# Patient Record
Sex: Male | Born: 1945
Health system: Southern US, Community
[De-identification: ages and names within clinical notes are randomized; demographics above are authoritative.]

## PROBLEM LIST (undated history)

## (undated) DIAGNOSIS — H3320 Serous retinal detachment, unspecified eye: Secondary | ICD-10-CM

## (undated) DIAGNOSIS — H409 Unspecified glaucoma: Secondary | ICD-10-CM

## (undated) DIAGNOSIS — R413 Other amnesia: Secondary | ICD-10-CM

## (undated) DIAGNOSIS — G8929 Other chronic pain: Secondary | ICD-10-CM

## (undated) DIAGNOSIS — M25559 Pain in unspecified hip: Secondary | ICD-10-CM

## (undated) DIAGNOSIS — C61 Malignant neoplasm of prostate: Secondary | ICD-10-CM

## (undated) DIAGNOSIS — E7800 Pure hypercholesterolemia, unspecified: Secondary | ICD-10-CM

## (undated) DIAGNOSIS — I10 Essential (primary) hypertension: Secondary | ICD-10-CM

## (undated) HISTORY — DX: Pure hypercholesterolemia, unspecified: E78.00

## (undated) HISTORY — DX: Serous retinal detachment, unspecified eye: H33.20

## (undated) HISTORY — DX: Other chronic pain: G89.29

## (undated) HISTORY — DX: Malignant neoplasm of prostate: C61

## (undated) HISTORY — PX: TOTAL HIP ARTHROPLASTY: SHX124

## (undated) HISTORY — DX: Pain in unspecified hip: M25.559

## (undated) HISTORY — DX: Essential (primary) hypertension: I10

## (undated) HISTORY — DX: Unspecified glaucoma: H40.9

## (undated) HISTORY — DX: Other amnesia: R41.3

---

## 2002-02-07 ENCOUNTER — Encounter (HOSPITAL_COMMUNITY): Admission: RE | Admit: 2002-02-07 | Discharge: 2002-03-09 | Payer: Self-pay

## 2006-10-13 ENCOUNTER — Ambulatory Visit (HOSPITAL_COMMUNITY): Admission: RE | Admit: 2006-10-13 | Discharge: 2006-10-13 | Payer: Non-veteran care | Admitting: General Surgery

## 2010-08-30 NOTE — H&P (Signed)
Bernard Roy, BOSSI               ACCOUNT NO.:  [id number]   MEDICAL RECORD NO.:  [id number]          PATIENT TYPE:  AMB   LOCATION:                                FACILITY:  [facility]   PHYSICIAN:  Dalia Heading, M.D.  DATE OF BIRTH:  [birth year]   DATE OF ADMISSION:  DATE OF DISCHARGE:  LH                              HISTORY & PHYSICAL   CHIEF COMPLAINT:  History of colon polyps.   HISTORY OF PRESENT ILLNESS:  Patient is a 65 year old black male who is  referred for endoscopic evaluation.  He needs a colonoscopy for followup  of colon polyps.  No abdominal pain, weight loss, nausea, vomiting,  diarrhea, constipation, melena, hematochezia have been noted.  He last  had a colonoscopy five years ago.  There is no family history of colon  carcinoma.   PAST MEDICAL HISTORY:  Hypertension.   PAST SURGICAL HISTORY:  Hip replacements.   CURRENT MEDICATIONS:  Motrin, a blood pressure pill, high cholesterol  pill.   ALLERGIES:  No known drug allergies.   REVIEW OF SYSTEMS:  Noncontributory.   PHYSICAL EXAMINATION:  GENERAL:  Patient is a well-developed and well-  nourished __________ male in no acute distress.  LUNGS:  Clear to auscultation with equal breath sounds bilaterally.  HEART:  Regular rate and rhythm without S3, S4, or murmurs.  ABDOMEN:  Soft, nontender, nondistended.  No hepatosplenomegaly or  masses are noted.  RECTAL:  Deferred to the procedure.   IMPRESSION:  History of colon polyps.   PLAN:  Patient is scheduled for a colonoscopy on October 13, 2006.  The  risks and benefits of the procedure, including bleeding and perforation,  were fully explained to the patient, who gave informed consent.      Dalia Heading, M.D.  Electronically Signed    MAJ/MEDQ  D:  09/22/2006  T:  09/22/2006  Job:  [id number]

## 2012-09-13 ENCOUNTER — Ambulatory Visit: Payer: Non-veteran care | Attending: Internal Medicine | Admitting: Physical Therapy

## 2012-09-13 DIAGNOSIS — R262 Difficulty in walking, not elsewhere classified: Secondary | ICD-10-CM | POA: Insufficient documentation

## 2012-09-13 DIAGNOSIS — IMO0001 Reserved for inherently not codable concepts without codable children: Secondary | ICD-10-CM | POA: Insufficient documentation

## 2012-09-13 DIAGNOSIS — R5381 Other malaise: Secondary | ICD-10-CM | POA: Insufficient documentation

## 2012-09-13 DIAGNOSIS — M545 Low back pain, unspecified: Secondary | ICD-10-CM | POA: Insufficient documentation

## 2012-09-13 DIAGNOSIS — R293 Abnormal posture: Secondary | ICD-10-CM | POA: Insufficient documentation

## 2012-09-28 ENCOUNTER — Ambulatory Visit: Payer: Non-veteran care | Admitting: Physical Therapy

## 2012-10-11 ENCOUNTER — Ambulatory Visit: Payer: Non-veteran care | Admitting: Physical Therapy

## 2012-10-14 ENCOUNTER — Ambulatory Visit: Payer: Non-veteran care | Attending: Internal Medicine | Admitting: Physical Therapy

## 2012-10-14 DIAGNOSIS — R5381 Other malaise: Secondary | ICD-10-CM | POA: Insufficient documentation

## 2012-10-14 DIAGNOSIS — M545 Low back pain, unspecified: Secondary | ICD-10-CM | POA: Insufficient documentation

## 2012-10-14 DIAGNOSIS — R262 Difficulty in walking, not elsewhere classified: Secondary | ICD-10-CM | POA: Insufficient documentation

## 2012-10-14 DIAGNOSIS — R293 Abnormal posture: Secondary | ICD-10-CM | POA: Insufficient documentation

## 2012-10-14 DIAGNOSIS — IMO0001 Reserved for inherently not codable concepts without codable children: Secondary | ICD-10-CM | POA: Insufficient documentation

## 2012-10-18 ENCOUNTER — Ambulatory Visit: Payer: Non-veteran care | Admitting: Physical Therapy

## 2012-10-21 ENCOUNTER — Ambulatory Visit: Payer: Non-veteran care | Admitting: Physical Therapy

## 2012-10-26 ENCOUNTER — Ambulatory Visit: Payer: Non-veteran care | Admitting: Physical Therapy

## 2012-10-28 ENCOUNTER — Ambulatory Visit: Payer: Non-veteran care | Admitting: Physical Therapy

## 2012-11-01 ENCOUNTER — Ambulatory Visit: Payer: Non-veteran care | Admitting: Physical Therapy

## 2012-11-04 ENCOUNTER — Ambulatory Visit: Payer: Non-veteran care | Admitting: Physical Therapy

## 2014-04-14 HISTORY — PX: COLONOSCOPY: SHX174

## 2016-09-20 LAB — BASIC METABOLIC PANEL: GLUCOSE: 97

## 2017-07-10 DIAGNOSIS — G4700 Insomnia, unspecified: Secondary | ICD-10-CM | POA: Diagnosis not present

## 2017-07-10 DIAGNOSIS — H409 Unspecified glaucoma: Secondary | ICD-10-CM | POA: Diagnosis not present

## 2017-07-10 DIAGNOSIS — M545 Low back pain: Secondary | ICD-10-CM | POA: Diagnosis not present

## 2017-07-10 DIAGNOSIS — I10 Essential (primary) hypertension: Secondary | ICD-10-CM | POA: Diagnosis not present

## 2017-07-10 DIAGNOSIS — M62830 Muscle spasm of back: Secondary | ICD-10-CM | POA: Diagnosis not present

## 2017-07-10 DIAGNOSIS — E782 Mixed hyperlipidemia: Secondary | ICD-10-CM | POA: Diagnosis not present

## 2017-07-21 DIAGNOSIS — M62830 Muscle spasm of back: Secondary | ICD-10-CM | POA: Diagnosis not present

## 2017-07-21 DIAGNOSIS — M545 Low back pain: Secondary | ICD-10-CM | POA: Diagnosis not present

## 2017-07-21 DIAGNOSIS — I10 Essential (primary) hypertension: Secondary | ICD-10-CM | POA: Diagnosis not present

## 2017-07-21 DIAGNOSIS — H409 Unspecified glaucoma: Secondary | ICD-10-CM | POA: Diagnosis not present

## 2017-07-21 DIAGNOSIS — R7301 Impaired fasting glucose: Secondary | ICD-10-CM | POA: Diagnosis not present

## 2017-07-21 DIAGNOSIS — G4700 Insomnia, unspecified: Secondary | ICD-10-CM | POA: Diagnosis not present

## 2017-07-21 DIAGNOSIS — E782 Mixed hyperlipidemia: Secondary | ICD-10-CM | POA: Diagnosis not present

## 2017-07-24 DIAGNOSIS — M545 Low back pain: Secondary | ICD-10-CM | POA: Diagnosis not present

## 2017-07-24 DIAGNOSIS — M62830 Muscle spasm of back: Secondary | ICD-10-CM | POA: Diagnosis not present

## 2017-07-24 DIAGNOSIS — E782 Mixed hyperlipidemia: Secondary | ICD-10-CM | POA: Diagnosis not present

## 2017-07-24 DIAGNOSIS — H01149 Xeroderma of unspecified eye, unspecified eyelid: Secondary | ICD-10-CM | POA: Diagnosis not present

## 2017-07-24 DIAGNOSIS — R69 Illness, unspecified: Secondary | ICD-10-CM | POA: Diagnosis not present

## 2017-07-24 DIAGNOSIS — Z6835 Body mass index (BMI) 35.0-35.9, adult: Secondary | ICD-10-CM | POA: Diagnosis not present

## 2017-07-24 DIAGNOSIS — K5900 Constipation, unspecified: Secondary | ICD-10-CM | POA: Diagnosis not present

## 2017-07-24 DIAGNOSIS — H401130 Primary open-angle glaucoma, bilateral, stage unspecified: Secondary | ICD-10-CM | POA: Diagnosis not present

## 2017-07-24 DIAGNOSIS — I10 Essential (primary) hypertension: Secondary | ICD-10-CM | POA: Diagnosis not present

## 2017-07-24 DIAGNOSIS — R7301 Impaired fasting glucose: Secondary | ICD-10-CM | POA: Diagnosis not present

## 2017-10-01 DIAGNOSIS — I10 Essential (primary) hypertension: Secondary | ICD-10-CM | POA: Diagnosis not present

## 2017-10-01 DIAGNOSIS — H5200 Hypermetropia, unspecified eye: Secondary | ICD-10-CM | POA: Diagnosis not present

## 2017-10-01 DIAGNOSIS — Z7409 Other reduced mobility: Secondary | ICD-10-CM | POA: Diagnosis not present

## 2017-10-01 DIAGNOSIS — M62830 Muscle spasm of back: Secondary | ICD-10-CM | POA: Diagnosis not present

## 2017-10-01 DIAGNOSIS — M545 Low back pain: Secondary | ICD-10-CM | POA: Diagnosis not present

## 2017-10-01 DIAGNOSIS — Z6835 Body mass index (BMI) 35.0-35.9, adult: Secondary | ICD-10-CM | POA: Diagnosis not present

## 2017-10-01 DIAGNOSIS — H40009 Preglaucoma, unspecified, unspecified eye: Secondary | ICD-10-CM | POA: Diagnosis not present

## 2017-10-12 DIAGNOSIS — M62830 Muscle spasm of back: Secondary | ICD-10-CM | POA: Diagnosis not present

## 2017-10-12 DIAGNOSIS — E782 Mixed hyperlipidemia: Secondary | ICD-10-CM | POA: Diagnosis not present

## 2017-10-12 DIAGNOSIS — H01149 Xeroderma of unspecified eye, unspecified eyelid: Secondary | ICD-10-CM | POA: Diagnosis not present

## 2017-10-12 DIAGNOSIS — H409 Unspecified glaucoma: Secondary | ICD-10-CM | POA: Diagnosis not present

## 2017-10-12 DIAGNOSIS — K5900 Constipation, unspecified: Secondary | ICD-10-CM | POA: Diagnosis not present

## 2017-10-12 DIAGNOSIS — G4700 Insomnia, unspecified: Secondary | ICD-10-CM | POA: Diagnosis not present

## 2017-10-12 DIAGNOSIS — Z6835 Body mass index (BMI) 35.0-35.9, adult: Secondary | ICD-10-CM | POA: Diagnosis not present

## 2017-10-12 DIAGNOSIS — I10 Essential (primary) hypertension: Secondary | ICD-10-CM | POA: Diagnosis not present

## 2017-10-12 DIAGNOSIS — H401130 Primary open-angle glaucoma, bilateral, stage unspecified: Secondary | ICD-10-CM | POA: Diagnosis not present

## 2017-10-12 DIAGNOSIS — M545 Low back pain: Secondary | ICD-10-CM | POA: Diagnosis not present

## 2017-10-22 DIAGNOSIS — K5900 Constipation, unspecified: Secondary | ICD-10-CM | POA: Diagnosis not present

## 2017-10-22 DIAGNOSIS — H409 Unspecified glaucoma: Secondary | ICD-10-CM | POA: Diagnosis not present

## 2017-10-22 DIAGNOSIS — G4700 Insomnia, unspecified: Secondary | ICD-10-CM | POA: Diagnosis not present

## 2017-10-22 DIAGNOSIS — R69 Illness, unspecified: Secondary | ICD-10-CM | POA: Diagnosis not present

## 2017-10-22 DIAGNOSIS — I10 Essential (primary) hypertension: Secondary | ICD-10-CM | POA: Diagnosis not present

## 2017-10-22 DIAGNOSIS — H401130 Primary open-angle glaucoma, bilateral, stage unspecified: Secondary | ICD-10-CM | POA: Diagnosis not present

## 2017-10-22 DIAGNOSIS — E782 Mixed hyperlipidemia: Secondary | ICD-10-CM | POA: Diagnosis not present

## 2017-10-22 DIAGNOSIS — M62830 Muscle spasm of back: Secondary | ICD-10-CM | POA: Diagnosis not present

## 2017-10-22 DIAGNOSIS — H01149 Xeroderma of unspecified eye, unspecified eyelid: Secondary | ICD-10-CM | POA: Diagnosis not present

## 2017-10-22 DIAGNOSIS — M545 Low back pain: Secondary | ICD-10-CM | POA: Diagnosis not present

## 2017-10-28 DIAGNOSIS — M545 Low back pain: Secondary | ICD-10-CM | POA: Diagnosis not present

## 2017-10-28 DIAGNOSIS — H409 Unspecified glaucoma: Secondary | ICD-10-CM | POA: Diagnosis not present

## 2017-10-28 DIAGNOSIS — K5900 Constipation, unspecified: Secondary | ICD-10-CM | POA: Diagnosis not present

## 2017-10-28 DIAGNOSIS — I10 Essential (primary) hypertension: Secondary | ICD-10-CM | POA: Diagnosis not present

## 2017-10-28 DIAGNOSIS — N189 Chronic kidney disease, unspecified: Secondary | ICD-10-CM | POA: Diagnosis not present

## 2017-10-28 DIAGNOSIS — M62830 Muscle spasm of back: Secondary | ICD-10-CM | POA: Diagnosis not present

## 2017-10-28 DIAGNOSIS — E782 Mixed hyperlipidemia: Secondary | ICD-10-CM | POA: Diagnosis not present

## 2017-10-28 DIAGNOSIS — Q821 Xeroderma pigmentosum: Secondary | ICD-10-CM | POA: Diagnosis not present

## 2017-10-28 DIAGNOSIS — R7303 Prediabetes: Secondary | ICD-10-CM | POA: Diagnosis not present

## 2017-10-28 DIAGNOSIS — G4700 Insomnia, unspecified: Secondary | ICD-10-CM | POA: Diagnosis not present

## 2017-11-30 ENCOUNTER — Encounter (INDEPENDENT_AMBULATORY_CARE_PROVIDER_SITE_OTHER): Payer: Self-pay | Admitting: *Deleted

## 2017-12-08 ENCOUNTER — Ambulatory Visit: Payer: Self-pay | Admitting: Orthopedic Surgery

## 2017-12-21 ENCOUNTER — Ambulatory Visit (INDEPENDENT_AMBULATORY_CARE_PROVIDER_SITE_OTHER): Payer: Medicare HMO | Admitting: Podiatry

## 2017-12-21 ENCOUNTER — Encounter: Payer: Self-pay | Admitting: Podiatry

## 2017-12-21 DIAGNOSIS — Q828 Other specified congenital malformations of skin: Secondary | ICD-10-CM | POA: Diagnosis not present

## 2017-12-21 DIAGNOSIS — B351 Tinea unguium: Secondary | ICD-10-CM | POA: Diagnosis not present

## 2017-12-21 DIAGNOSIS — M79674 Pain in right toe(s): Secondary | ICD-10-CM

## 2017-12-21 DIAGNOSIS — M79672 Pain in left foot: Secondary | ICD-10-CM

## 2017-12-21 DIAGNOSIS — M79675 Pain in left toe(s): Secondary | ICD-10-CM

## 2017-12-21 DIAGNOSIS — M79671 Pain in right foot: Secondary | ICD-10-CM

## 2017-12-21 NOTE — Progress Notes (Signed)
   Subjective:    Patient ID: Bernard Roy, male    DOB: 1945-06-27, 72 y.o.   MRN: [id number]  HPI  72 year old male presents the office today for concerns of thick, painful, elongated toenails that he cannot trim himself as well as the calluses to both of his feet.  He is asking for new inserts and shoes as well.  He denies any swelling or redness he has no other concerns.  He is a patient at the [facility].   Review of Systems  All other systems reviewed and are negative.  History reviewed. No pertinent past medical history.  History reviewed. No pertinent surgical history.   Current Outpatient Medications:  .  LISINOPRIL PO, Take by mouth., Disp: , Rfl:  .  amLODipine (NORVASC) 5 MG tablet, TAKE 1 TABLET BY MOUTH ONCE DAILY AT NIGHT, Disp: , Rfl: 2  No Known Allergies       Objective:   Physical Exam General: AAO x3, NAD  Dermatological: Nails are hypertrophic, dystrophic, brittle, discolored, elongated 10. No surrounding redness or drainage. Tenderness nails 1-5 bilaterally.  Hyperkeratotic lesions bilateral submetatarsal 1, 5.  Without any underlying ulceration drainage or signs of infection.  No open lesions or pre-ulcerative lesions are identified today.  Vascular: Dorsalis Pedis artery and Posterior Tibial artery pedal pulses are 2/4 bilateral with immedate capillary fill time.  There is no pain with calf compression, swelling, warmth, erythema.   Neruologic: Grossly intact via light touch bilateral. Protective threshold with Semmes Wienstein monofilament intact to all pedal sites bilateral.   Musculoskeletal: Prominent metatarsal head plantarly with atrophy of the [place].  Muscular strength 5/5 in all groups tested bilateral.    Assessment & Plan:  72 year old male with symptomatic onychomycosis, hyperkeratotic lesions -Treatment options discussed including all alternatives, risks, and complications -Etiology of symptoms were discussed -Nails debrided 10  without complications or bleeding. -Hyperkeratotic lesion sharply debrided x4 without any complications or bleeding. -Prescription for new shoes as well as for inserts was given today to [employer] and is going to be sent to the [place].  -Daily foot inspection -Follow-up in 3 months or sooner if any problems arise. In the meantime, encouraged to call the office with any questions, concerns, change in symptoms.   Celesta Gentile, DPM

## 2018-02-19 ENCOUNTER — Encounter (INDEPENDENT_AMBULATORY_CARE_PROVIDER_SITE_OTHER): Payer: Self-pay | Admitting: *Deleted

## 2018-05-10 DIAGNOSIS — H01149 Xeroderma of unspecified eye, unspecified eyelid: Secondary | ICD-10-CM | POA: Diagnosis not present

## 2018-05-10 DIAGNOSIS — E782 Mixed hyperlipidemia: Secondary | ICD-10-CM | POA: Diagnosis not present

## 2018-05-10 DIAGNOSIS — H409 Unspecified glaucoma: Secondary | ICD-10-CM | POA: Diagnosis not present

## 2018-05-10 DIAGNOSIS — G4700 Insomnia, unspecified: Secondary | ICD-10-CM | POA: Diagnosis not present

## 2018-05-10 DIAGNOSIS — I10 Essential (primary) hypertension: Secondary | ICD-10-CM | POA: Diagnosis not present

## 2018-05-10 DIAGNOSIS — N189 Chronic kidney disease, unspecified: Secondary | ICD-10-CM | POA: Diagnosis not present

## 2018-05-10 DIAGNOSIS — Z6835 Body mass index (BMI) 35.0-35.9, adult: Secondary | ICD-10-CM | POA: Diagnosis not present

## 2018-05-10 DIAGNOSIS — R252 Cramp and spasm: Secondary | ICD-10-CM | POA: Diagnosis not present

## 2018-05-10 DIAGNOSIS — R7303 Prediabetes: Secondary | ICD-10-CM | POA: Diagnosis not present

## 2018-05-10 DIAGNOSIS — Z125 Encounter for screening for malignant neoplasm of prostate: Secondary | ICD-10-CM | POA: Diagnosis not present

## 2018-05-10 DIAGNOSIS — R7301 Impaired fasting glucose: Secondary | ICD-10-CM | POA: Diagnosis not present

## 2018-05-10 DIAGNOSIS — M545 Low back pain: Secondary | ICD-10-CM | POA: Diagnosis not present

## 2018-05-10 DIAGNOSIS — N182 Chronic kidney disease, stage 2 (mild): Secondary | ICD-10-CM | POA: Diagnosis not present

## 2018-05-10 DIAGNOSIS — K5900 Constipation, unspecified: Secondary | ICD-10-CM | POA: Diagnosis not present

## 2018-07-02 DIAGNOSIS — M545 Low back pain: Secondary | ICD-10-CM | POA: Diagnosis not present

## 2018-07-02 DIAGNOSIS — G8929 Other chronic pain: Secondary | ICD-10-CM | POA: Diagnosis not present

## 2018-08-04 DIAGNOSIS — M545 Low back pain: Secondary | ICD-10-CM | POA: Diagnosis not present

## 2018-08-04 DIAGNOSIS — M47817 Spondylosis without myelopathy or radiculopathy, lumbosacral region: Secondary | ICD-10-CM | POA: Diagnosis not present

## 2018-08-04 DIAGNOSIS — M160 Bilateral primary osteoarthritis of hip: Secondary | ICD-10-CM | POA: Diagnosis not present

## 2018-08-04 DIAGNOSIS — G894 Chronic pain syndrome: Secondary | ICD-10-CM | POA: Diagnosis not present

## 2018-08-10 DIAGNOSIS — I10 Essential (primary) hypertension: Secondary | ICD-10-CM | POA: Diagnosis not present

## 2018-08-10 DIAGNOSIS — M25559 Pain in unspecified hip: Secondary | ICD-10-CM | POA: Diagnosis not present

## 2018-08-10 DIAGNOSIS — M542 Cervicalgia: Secondary | ICD-10-CM | POA: Diagnosis not present

## 2018-08-10 DIAGNOSIS — M47819 Spondylosis without myelopathy or radiculopathy, site unspecified: Secondary | ICD-10-CM | POA: Diagnosis not present

## 2018-08-10 DIAGNOSIS — M545 Low back pain: Secondary | ICD-10-CM | POA: Diagnosis not present

## 2018-08-10 DIAGNOSIS — M2578 Osteophyte, vertebrae: Secondary | ICD-10-CM | POA: Diagnosis not present

## 2018-08-11 DIAGNOSIS — M47817 Spondylosis without myelopathy or radiculopathy, lumbosacral region: Secondary | ICD-10-CM | POA: Diagnosis not present

## 2018-09-07 DIAGNOSIS — R7301 Impaired fasting glucose: Secondary | ICD-10-CM | POA: Diagnosis not present

## 2018-09-07 DIAGNOSIS — R7303 Prediabetes: Secondary | ICD-10-CM | POA: Diagnosis not present

## 2018-09-07 DIAGNOSIS — I10 Essential (primary) hypertension: Secondary | ICD-10-CM | POA: Diagnosis not present

## 2018-09-07 DIAGNOSIS — E782 Mixed hyperlipidemia: Secondary | ICD-10-CM | POA: Diagnosis not present

## 2018-09-07 DIAGNOSIS — N189 Chronic kidney disease, unspecified: Secondary | ICD-10-CM | POA: Diagnosis not present

## 2018-09-10 DIAGNOSIS — R7303 Prediabetes: Secondary | ICD-10-CM | POA: Diagnosis not present

## 2018-09-10 DIAGNOSIS — I129 Hypertensive chronic kidney disease with stage 1 through stage 4 chronic kidney disease, or unspecified chronic kidney disease: Secondary | ICD-10-CM | POA: Diagnosis not present

## 2018-09-10 DIAGNOSIS — N183 Chronic kidney disease, stage 3 (moderate): Secondary | ICD-10-CM | POA: Diagnosis not present

## 2018-09-10 DIAGNOSIS — E782 Mixed hyperlipidemia: Secondary | ICD-10-CM | POA: Diagnosis not present

## 2018-09-10 DIAGNOSIS — R945 Abnormal results of liver function studies: Secondary | ICD-10-CM | POA: Diagnosis not present

## 2018-09-13 DIAGNOSIS — M47817 Spondylosis without myelopathy or radiculopathy, lumbosacral region: Secondary | ICD-10-CM | POA: Diagnosis not present

## 2018-09-20 DIAGNOSIS — G4700 Insomnia, unspecified: Secondary | ICD-10-CM | POA: Diagnosis not present

## 2018-09-20 DIAGNOSIS — E782 Mixed hyperlipidemia: Secondary | ICD-10-CM | POA: Diagnosis not present

## 2018-09-20 DIAGNOSIS — N390 Urinary tract infection, site not specified: Secondary | ICD-10-CM | POA: Diagnosis not present

## 2018-09-20 DIAGNOSIS — G8929 Other chronic pain: Secondary | ICD-10-CM | POA: Diagnosis not present

## 2018-09-20 DIAGNOSIS — F5101 Primary insomnia: Secondary | ICD-10-CM | POA: Diagnosis not present

## 2018-09-20 DIAGNOSIS — H01149 Xeroderma of unspecified eye, unspecified eyelid: Secondary | ICD-10-CM | POA: Diagnosis not present

## 2018-09-24 DIAGNOSIS — H01149 Xeroderma of unspecified eye, unspecified eyelid: Secondary | ICD-10-CM | POA: Diagnosis not present

## 2018-09-24 DIAGNOSIS — M545 Low back pain: Secondary | ICD-10-CM | POA: Diagnosis not present

## 2018-09-24 DIAGNOSIS — G4700 Insomnia, unspecified: Secondary | ICD-10-CM | POA: Diagnosis not present

## 2018-09-24 DIAGNOSIS — F5101 Primary insomnia: Secondary | ICD-10-CM | POA: Diagnosis not present

## 2018-09-24 DIAGNOSIS — R300 Dysuria: Secondary | ICD-10-CM | POA: Diagnosis not present

## 2018-09-24 DIAGNOSIS — E782 Mixed hyperlipidemia: Secondary | ICD-10-CM | POA: Diagnosis not present

## 2018-09-24 DIAGNOSIS — R7303 Prediabetes: Secondary | ICD-10-CM | POA: Diagnosis not present

## 2018-09-24 DIAGNOSIS — G8929 Other chronic pain: Secondary | ICD-10-CM | POA: Diagnosis not present

## 2018-09-27 DIAGNOSIS — M47817 Spondylosis without myelopathy or radiculopathy, lumbosacral region: Secondary | ICD-10-CM | POA: Diagnosis not present

## 2018-09-27 DIAGNOSIS — G894 Chronic pain syndrome: Secondary | ICD-10-CM | POA: Diagnosis not present

## 2018-09-27 DIAGNOSIS — M160 Bilateral primary osteoarthritis of hip: Secondary | ICD-10-CM | POA: Diagnosis not present

## 2018-09-27 DIAGNOSIS — Z96643 Presence of artificial hip joint, bilateral: Secondary | ICD-10-CM | POA: Diagnosis not present

## 2018-09-28 DIAGNOSIS — M25559 Pain in unspecified hip: Secondary | ICD-10-CM | POA: Diagnosis not present

## 2018-09-28 DIAGNOSIS — I10 Essential (primary) hypertension: Secondary | ICD-10-CM | POA: Diagnosis not present

## 2018-09-28 DIAGNOSIS — M545 Low back pain: Secondary | ICD-10-CM | POA: Diagnosis not present

## 2018-09-28 DIAGNOSIS — M542 Cervicalgia: Secondary | ICD-10-CM | POA: Diagnosis not present

## 2018-09-28 DIAGNOSIS — M47819 Spondylosis without myelopathy or radiculopathy, site unspecified: Secondary | ICD-10-CM | POA: Diagnosis not present

## 2018-09-30 DIAGNOSIS — E782 Mixed hyperlipidemia: Secondary | ICD-10-CM | POA: Diagnosis not present

## 2018-09-30 DIAGNOSIS — M545 Low back pain: Secondary | ICD-10-CM | POA: Diagnosis not present

## 2018-09-30 DIAGNOSIS — N390 Urinary tract infection, site not specified: Secondary | ICD-10-CM | POA: Diagnosis not present

## 2018-09-30 DIAGNOSIS — I10 Essential (primary) hypertension: Secondary | ICD-10-CM | POA: Diagnosis not present

## 2018-09-30 DIAGNOSIS — R7303 Prediabetes: Secondary | ICD-10-CM | POA: Diagnosis not present

## 2018-09-30 DIAGNOSIS — Z0000 Encounter for general adult medical examination without abnormal findings: Secondary | ICD-10-CM | POA: Diagnosis not present

## 2018-09-30 DIAGNOSIS — H409 Unspecified glaucoma: Secondary | ICD-10-CM | POA: Diagnosis not present

## 2018-09-30 DIAGNOSIS — G4700 Insomnia, unspecified: Secondary | ICD-10-CM | POA: Diagnosis not present

## 2018-10-06 DIAGNOSIS — G894 Chronic pain syndrome: Secondary | ICD-10-CM | POA: Diagnosis not present

## 2018-10-06 DIAGNOSIS — Z79891 Long term (current) use of opiate analgesic: Secondary | ICD-10-CM | POA: Diagnosis not present

## 2018-10-06 DIAGNOSIS — M47817 Spondylosis without myelopathy or radiculopathy, lumbosacral region: Secondary | ICD-10-CM | POA: Diagnosis not present

## 2018-10-06 DIAGNOSIS — Z79899 Other long term (current) drug therapy: Secondary | ICD-10-CM | POA: Diagnosis not present

## 2018-11-03 DIAGNOSIS — M545 Low back pain: Secondary | ICD-10-CM | POA: Diagnosis not present

## 2018-11-03 DIAGNOSIS — G894 Chronic pain syndrome: Secondary | ICD-10-CM | POA: Diagnosis not present

## 2018-11-03 DIAGNOSIS — M160 Bilateral primary osteoarthritis of hip: Secondary | ICD-10-CM | POA: Diagnosis not present

## 2018-11-03 DIAGNOSIS — M47817 Spondylosis without myelopathy or radiculopathy, lumbosacral region: Secondary | ICD-10-CM | POA: Diagnosis not present

## 2018-12-09 DIAGNOSIS — I10 Essential (primary) hypertension: Secondary | ICD-10-CM | POA: Diagnosis not present

## 2018-12-09 DIAGNOSIS — H409 Unspecified glaucoma: Secondary | ICD-10-CM | POA: Diagnosis not present

## 2018-12-09 DIAGNOSIS — M545 Low back pain: Secondary | ICD-10-CM | POA: Diagnosis not present

## 2018-12-09 DIAGNOSIS — E782 Mixed hyperlipidemia: Secondary | ICD-10-CM | POA: Diagnosis not present

## 2018-12-09 DIAGNOSIS — G4700 Insomnia, unspecified: Secondary | ICD-10-CM | POA: Diagnosis not present

## 2019-01-17 DIAGNOSIS — R8279 Other abnormal findings on microbiological examination of urine: Secondary | ICD-10-CM | POA: Diagnosis not present

## 2019-01-17 DIAGNOSIS — N3000 Acute cystitis without hematuria: Secondary | ICD-10-CM | POA: Diagnosis not present

## 2019-01-24 DIAGNOSIS — G4700 Insomnia, unspecified: Secondary | ICD-10-CM | POA: Diagnosis not present

## 2019-01-24 DIAGNOSIS — H409 Unspecified glaucoma: Secondary | ICD-10-CM | POA: Diagnosis not present

## 2019-01-24 DIAGNOSIS — F5101 Primary insomnia: Secondary | ICD-10-CM | POA: Diagnosis not present

## 2019-01-24 DIAGNOSIS — E782 Mixed hyperlipidemia: Secondary | ICD-10-CM | POA: Diagnosis not present

## 2019-01-24 DIAGNOSIS — I10 Essential (primary) hypertension: Secondary | ICD-10-CM | POA: Diagnosis not present

## 2019-02-01 DIAGNOSIS — E782 Mixed hyperlipidemia: Secondary | ICD-10-CM | POA: Diagnosis not present

## 2019-02-01 DIAGNOSIS — R7303 Prediabetes: Secondary | ICD-10-CM | POA: Diagnosis not present

## 2019-02-01 DIAGNOSIS — I10 Essential (primary) hypertension: Secondary | ICD-10-CM | POA: Diagnosis not present

## 2019-02-01 DIAGNOSIS — R7301 Impaired fasting glucose: Secondary | ICD-10-CM | POA: Diagnosis not present

## 2019-02-07 DIAGNOSIS — E782 Mixed hyperlipidemia: Secondary | ICD-10-CM | POA: Diagnosis not present

## 2019-02-07 DIAGNOSIS — I129 Hypertensive chronic kidney disease with stage 1 through stage 4 chronic kidney disease, or unspecified chronic kidney disease: Secondary | ICD-10-CM | POA: Diagnosis not present

## 2019-02-07 DIAGNOSIS — Z23 Encounter for immunization: Secondary | ICD-10-CM | POA: Diagnosis not present

## 2019-02-07 DIAGNOSIS — R945 Abnormal results of liver function studies: Secondary | ICD-10-CM | POA: Diagnosis not present

## 2019-02-07 DIAGNOSIS — N1831 Chronic kidney disease, stage 3a: Secondary | ICD-10-CM | POA: Diagnosis not present

## 2019-02-14 DIAGNOSIS — N1831 Chronic kidney disease, stage 3a: Secondary | ICD-10-CM | POA: Diagnosis not present

## 2019-02-14 DIAGNOSIS — E782 Mixed hyperlipidemia: Secondary | ICD-10-CM | POA: Diagnosis not present

## 2019-03-01 ENCOUNTER — Other Ambulatory Visit (HOSPITAL_COMMUNITY): Payer: Self-pay | Admitting: Urology

## 2019-03-01 DIAGNOSIS — R9721 Rising PSA following treatment for malignant neoplasm of prostate: Secondary | ICD-10-CM

## 2019-03-15 DIAGNOSIS — N1831 Chronic kidney disease, stage 3a: Secondary | ICD-10-CM | POA: Diagnosis not present

## 2019-03-15 DIAGNOSIS — M160 Bilateral primary osteoarthritis of hip: Secondary | ICD-10-CM | POA: Diagnosis not present

## 2019-03-15 DIAGNOSIS — I129 Hypertensive chronic kidney disease with stage 1 through stage 4 chronic kidney disease, or unspecified chronic kidney disease: Secondary | ICD-10-CM | POA: Diagnosis not present

## 2019-03-15 DIAGNOSIS — R945 Abnormal results of liver function studies: Secondary | ICD-10-CM | POA: Diagnosis not present

## 2019-03-15 DIAGNOSIS — E7849 Other hyperlipidemia: Secondary | ICD-10-CM | POA: Diagnosis not present

## 2019-03-23 ENCOUNTER — Encounter (HOSPITAL_COMMUNITY)
Admission: RE | Admit: 2019-03-23 | Discharge: 2019-03-23 | Disposition: A | Discharge status: Home / Self Care | Payer: Medicare Other | Source: Ambulatory Visit | Attending: Urology | Admitting: Urology

## 2019-03-23 ENCOUNTER — Other Ambulatory Visit: Payer: Self-pay

## 2019-03-23 DIAGNOSIS — R9721 Rising PSA following treatment for malignant neoplasm of prostate: Secondary | ICD-10-CM | POA: Diagnosis present

## 2019-03-23 MED ORDER — TECHNETIUM TC 99M MEDRONATE IV KIT
21.3000 | PACK | Freq: Once | INTRAVENOUS | Status: AC | PRN
  Administered 2019-03-23: 10:00:00 21.3 via INTRAVENOUS

## 2019-03-25 DIAGNOSIS — I129 Hypertensive chronic kidney disease with stage 1 through stage 4 chronic kidney disease, or unspecified chronic kidney disease: Secondary | ICD-10-CM | POA: Diagnosis not present

## 2019-03-25 DIAGNOSIS — M160 Bilateral primary osteoarthritis of hip: Secondary | ICD-10-CM | POA: Diagnosis not present

## 2019-03-25 DIAGNOSIS — R945 Abnormal results of liver function studies: Secondary | ICD-10-CM | POA: Diagnosis not present

## 2019-03-25 DIAGNOSIS — M545 Low back pain: Secondary | ICD-10-CM | POA: Diagnosis not present

## 2019-05-06 DIAGNOSIS — N189 Chronic kidney disease, unspecified: Secondary | ICD-10-CM | POA: Diagnosis not present

## 2019-05-06 DIAGNOSIS — I129 Hypertensive chronic kidney disease with stage 1 through stage 4 chronic kidney disease, or unspecified chronic kidney disease: Secondary | ICD-10-CM | POA: Diagnosis not present

## 2019-05-09 DIAGNOSIS — R3915 Urgency of urination: Secondary | ICD-10-CM | POA: Diagnosis not present

## 2019-05-10 DIAGNOSIS — I10 Essential (primary) hypertension: Secondary | ICD-10-CM | POA: Diagnosis not present

## 2019-05-10 DIAGNOSIS — M6281 Muscle weakness (generalized): Secondary | ICD-10-CM | POA: Diagnosis not present

## 2019-05-17 DIAGNOSIS — R9721 Rising PSA following treatment for malignant neoplasm of prostate: Secondary | ICD-10-CM | POA: Diagnosis not present

## 2019-06-01 ENCOUNTER — Ambulatory Visit: Payer: Medicare HMO | Attending: Internal Medicine

## 2019-06-01 DIAGNOSIS — Z23 Encounter for immunization: Secondary | ICD-10-CM | POA: Insufficient documentation

## 2019-06-01 NOTE — Progress Notes (Signed)
   Covid-19 Vaccination Clinic  Name:  Bernard Roy    MRN: [id number] DOB: 06-06-1945  06/01/2019  Bernard Roy was observed post Covid-19 immunization for 15 minutes without incidence. He was provided with Vaccine Information Sheet and instruction to access the V-Safe system.   Bernard Roy was instructed to call 911 with any severe reactions post vaccine: Marland Kitchen Difficulty breathing  . Swelling of your face and throat  . A fast heartbeat  . A bad rash all over your body  . Dizziness and weakness    Immunizations Administered    Name Date Dose VIS Date Route   Moderna COVID-19 Vaccine 06/01/2019 11:14 AM 0.5 mL 03/15/2019 Intramuscular   Manufacturer: Moderna   Lot: [id number]   [id number]

## 2019-06-10 DIAGNOSIS — M6281 Muscle weakness (generalized): Secondary | ICD-10-CM | POA: Diagnosis not present

## 2019-06-10 DIAGNOSIS — I10 Essential (primary) hypertension: Secondary | ICD-10-CM | POA: Diagnosis not present

## 2019-06-29 ENCOUNTER — Ambulatory Visit: Payer: Medicare HMO | Attending: Internal Medicine

## 2019-06-29 DIAGNOSIS — Z23 Encounter for immunization: Secondary | ICD-10-CM

## 2019-06-29 NOTE — Progress Notes (Signed)
   Covid-19 Vaccination Clinic  Name:  Bernard Roy    MRN: [id number] DOB: [birth year]  06/29/2019  Mr. Bernard Roy was observed post Covid-19 immunization for 15 minutes without incident. He was provided with Vaccine Information Sheet and instruction to access the V-Safe system.   Mr. Bernard Roy was instructed to call 911 with any severe reactions post vaccine: Marland Kitchen Difficulty breathing  . Swelling of face and throat  . A fast heartbeat  . A bad rash all over body  . Dizziness and weakness   Immunizations Administered    Name Date Dose VIS Date Route   Moderna COVID-19 Vaccine 06/29/2019 11:06 AM 0.5 mL 03/15/2019 Intramuscular   Manufacturer: Moderna   Lot: [id number]   [id number]

## 2019-06-30 ENCOUNTER — Encounter: Payer: Self-pay | Admitting: Internal Medicine

## 2019-07-08 DIAGNOSIS — I10 Essential (primary) hypertension: Secondary | ICD-10-CM | POA: Diagnosis not present

## 2019-07-08 DIAGNOSIS — M6281 Muscle weakness (generalized): Secondary | ICD-10-CM | POA: Diagnosis not present

## 2019-07-21 ENCOUNTER — Other Ambulatory Visit: Payer: Self-pay

## 2019-07-21 ENCOUNTER — Ambulatory Visit (INDEPENDENT_AMBULATORY_CARE_PROVIDER_SITE_OTHER): Payer: No Typology Code available for payment source | Admitting: Gastroenterology

## 2019-07-21 ENCOUNTER — Telehealth: Payer: Self-pay

## 2019-07-21 ENCOUNTER — Encounter: Payer: Self-pay | Admitting: Gastroenterology

## 2019-07-21 VITALS — BP 124/68 | HR 110 | Temp 95.5°F | Ht 74.0 in | Wt 244.0 lb

## 2019-07-21 DIAGNOSIS — R7989 Other specified abnormal findings of blood chemistry: Secondary | ICD-10-CM | POA: Diagnosis not present

## 2019-07-21 DIAGNOSIS — Z86010 Personal history of colonic polyps: Secondary | ICD-10-CM | POA: Diagnosis not present

## 2019-07-21 MED ORDER — PEG 3350-KCL-NA BICARB-NACL 420 G PO SOLR: 4000.0000 mL | ORAL | 0 refills | Status: DC

## 2019-07-21 NOTE — Progress Notes (Signed)
Primary Care Physician:  [facility]  Referring Physician: [facility] Primary Gastroenterologist:  Dr. Gala Romney   Chief Complaint  Patient presents with  . Colonoscopy    hx polyps    HPI:   Bernard Roy is a 74 y.o. male presenting today at the request of the [place] for surveillance colonoscopy. Per notes from the [place], last colonoscopy in 2016 with 5 sessile polyps in ascending colon, 3 sessile polyps in transverse colon, 5 sessile polyps in sigmoid, and sigmoid diverticulosis. Path unknown. Sister is present with him today. Bernard Roy).   No rectal bleeding. No constipation or diarrhea. No abdominal pain. No weight loss or lack of appetite. No dysphagia. No GERD. No N/V.   Prior remote colonoscopies with polyps. Believes this may be his 4th colonoscopy. No FH of colon cancer.   From review of notes, elevated alk phos chronically. Ranging from 130s-230s, with last in 2019 220 per [place].   Past Medical History:  Diagnosis Date  . Chronic back pain   . Detached retina   . Glaucoma   . Hip pain   . HTN (hypertension)   . Hypercholesterolemia   . Memory loss    per sister. Not officially diagnosed with dementia. Able to remember short term and long term but sometimes mentions things that may not have happened and sister does not recognize  . Prostate cancer ([place])    recurrent. Radiation with first episode.     Past Surgical History:  Procedure Laterality Date  . COLONOSCOPY  2016   5 sessile polyps in ascending colon, 3 sessile polyps in transverse colon, 5 sessile polyps in sigmoid, and sigmoid diverticulosis. Path unknown  . TOTAL HIP ARTHROPLASTY     bilateral hips, X 2    Current Outpatient Medications  Medication Sig Dispense Refill  . amLODipine (NORVASC) 5 MG tablet TAKE 1 TABLET BY MOUTH ONCE DAILY AT NIGHT  2  . atorvastatin (LIPITOR) 20 MG tablet Take 10 mg by mouth at bedtime.     Marland Kitchen HYDROcodone-acetaminophen (NORCO/VICODIN) 5-325 MG tablet  Take 1 tablet by mouth as needed.    . hydrOXYzine (ATARAX/VISTARIL) 10 MG tablet Take 20 mg by mouth at bedtime.    Marland Kitchen lisinopril (ZESTRIL) 20 MG tablet Take 10 mg by mouth daily.    Marland Kitchen zolpidem (AMBIEN) 10 MG tablet Take 1 tablet by mouth at bedtime.    . polyethylene glycol-electrolytes (TRILYTE) 420 g solution Take 4,000 mLs by mouth as directed. 4000 mL 0   No current facility-administered medications for this visit.    Allergies as of 07/21/2019  . (No Known Allergies)    Family History  Problem Relation Age of Onset  . Colon cancer Neg Hx   . Liver disease Neg Hx     Social History   Socioeconomic History  . Marital status: Single    Spouse name: Not on file  . Number of children: Not on file  . Years of education: Not on file  . Highest education level: Not on file  Occupational History  . Occupation: disability  Tobacco Use  . Smoking status: Former Research scientist (life sciences)  . Smokeless tobacco: Never Used  Substance and Sexual Activity  . Alcohol use: Not Currently    Comment: in the past  . Drug use: Never  . Sexual activity: Not on file  Other Topics Concern  . Not on file  Social History Narrative  . Not on file   Social Determinants of  Health   Financial Resource Strain:   . Difficulty of Paying Living Expenses:   Food Insecurity:   . Worried About Charity fundraiser in the Last Year:   . Arboriculturist in the Last Year:   Transportation Needs:   . Film/video editor (Medical):   Marland Kitchen Lack of Transportation (Non-Medical):   Physical Activity:   . Days of Exercise per Week:   . Minutes of Exercise per Session:   Stress:   . Feeling of Stress :   Social Connections:   . Frequency of Communication with Friends and Family:   . Frequency of Social Gatherings with Friends and Family:   . Attends Religious Services:   . Active Member of Clubs or Organizations:   . Attends Archivist Meetings:   Marland Kitchen Marital Status:   Intimate Partner Violence:   . Fear of  Current or Ex-Partner:   . Emotionally Abused:   Marland Kitchen Physically Abused:   . Sexually Abused:     Review of Systems: Gen: Denies any fever, chills, fatigue, weight loss, lack of appetite.  CV: Denies chest pain, heart palpitations, peripheral edema, syncope.  Resp: +DOE GI: see HPI GU : Denies urinary burning, urinary frequency, urinary hesitancy MS: +chronic back and hip pain Derm: Denies rash, itching, dry skin Psych: see HPI Heme: Denies bruising, bleeding, and enlarged lymph nodes.  Physical Exam: BP 124/68   Pulse (!) 110   Temp (!) 95.5 F (35.3 C) (Temporal)   Ht '6\' 2"'  (1.88 m)   Wt 244 lb (110.7 kg)   BMI 31.33 kg/m  General:   Alert and oriented. Pleasant and cooperative. Well-nourished and well-developed.  Head:  Normocephalic and atraumatic. Eyes:  Without icterus, sclera clear and conjunctiva pink.  Ears:  Normal auditory acuity. Mouth:  Mask in place Lungs:  Clear to auscultation bilaterally. No wheezes, rales, or rhonchi. No distress.  Heart:  S1, S2 present without murmurs appreciated.  Abdomen:  +BS, soft, non-tender and non-distended. No HSM noted. No guarding or rebound. No masses appreciated.  Rectal:  Deferred  Msk:  Walker for ambulation, unsteady gait Extremities:  Without edema. Neurologic:  Alert and  oriented x4;  grossly normal neurologically. Psych:  Alert and cooperative. Normal mood and affect.  ASSESSMENT: [name] is a 74 y.o. male presenting today with history of polyps, due for surveillance colonoscopy now. Last colonoscopy in 2016 at outside facility with multiple polyps, path unknown. Recommendations at that time for 3-year-surveillance. Also noted to have elevated alk phos historically, without recent labs.  Will pursue surveillance colonoscopy in near future with Propofol due to polypharmacy.  Elevated alk phos: repeat HFP with PCP (Dr. Nevada Crane) on 4/21. Orders given to patient today. Anticipate US liver, likely further serologies.     PLAN:  Proceed with TCS with Dr. Gala Romney in near future using PROPOFOL: the risks, benefits, and alternatives have been discussed with the patient in detail. The patient states understanding and desires to proceed.  HFP at next PCP appt and have faxed to [place]  Further recommendations to follow  Annitta Needs, PhD, ANP-BC [facility] \

## 2019-07-21 NOTE — Progress Notes (Signed)
Cc'ed to pcp °

## 2019-07-21 NOTE — Telephone Encounter (Signed)
[facility] Josem Kaufmann has been scanned into chart.  PA for TCS submitted via [employer] website. Case approved. [employer]# [id number], valid 09/13/19-10/13/19.

## 2019-07-21 NOTE — Patient Instructions (Signed)
We are arranging a colonoscopy in the near future.  Please take the lab order with you to Dr. Juel Burrow office when you go in a few weeks.  Further recommendations to follow!  It was a pleasure to see you today. I want to create trusting relationships with patients to provide genuine, compassionate, and quality care. I value your feedback. If you receive a survey regarding your visit,  I greatly appreciate you taking time to fill this out.   Annitta Needs, PhD, ANP-BC [facility]

## 2019-08-03 DIAGNOSIS — R7303 Prediabetes: Secondary | ICD-10-CM | POA: Diagnosis not present

## 2019-08-03 DIAGNOSIS — M62830 Muscle spasm of back: Secondary | ICD-10-CM | POA: Diagnosis not present

## 2019-08-03 DIAGNOSIS — E669 Obesity, unspecified: Secondary | ICD-10-CM | POA: Diagnosis not present

## 2019-08-03 DIAGNOSIS — E782 Mixed hyperlipidemia: Secondary | ICD-10-CM | POA: Diagnosis not present

## 2019-08-03 DIAGNOSIS — Z0000 Encounter for general adult medical examination without abnormal findings: Secondary | ICD-10-CM | POA: Diagnosis not present

## 2019-08-03 DIAGNOSIS — Z6836 Body mass index (BMI) 36.0-36.9, adult: Secondary | ICD-10-CM | POA: Diagnosis not present

## 2019-08-03 DIAGNOSIS — Z0001 Encounter for general adult medical examination with abnormal findings: Secondary | ICD-10-CM | POA: Diagnosis not present

## 2019-08-03 DIAGNOSIS — H409 Unspecified glaucoma: Secondary | ICD-10-CM | POA: Diagnosis not present

## 2019-08-03 DIAGNOSIS — I10 Essential (primary) hypertension: Secondary | ICD-10-CM | POA: Diagnosis not present

## 2019-08-04 ENCOUNTER — Other Ambulatory Visit: Payer: Self-pay | Admitting: Radiology

## 2019-08-04 DIAGNOSIS — C61 Malignant neoplasm of prostate: Secondary | ICD-10-CM

## 2019-08-08 ENCOUNTER — Other Ambulatory Visit: Payer: Self-pay

## 2019-08-08 ENCOUNTER — Encounter (HOSPITAL_COMMUNITY): Payer: Self-pay

## 2019-08-08 ENCOUNTER — Emergency Department (HOSPITAL_COMMUNITY): Payer: No Typology Code available for payment source

## 2019-08-08 ENCOUNTER — Emergency Department (HOSPITAL_COMMUNITY)
Admission: EM | Admit: 2019-08-08 | Discharge: 2019-08-08 | Disposition: A | Discharge status: Home / Self Care | Payer: No Typology Code available for payment source | Attending: Emergency Medicine | Admitting: Emergency Medicine

## 2019-08-08 DIAGNOSIS — I10 Essential (primary) hypertension: Secondary | ICD-10-CM | POA: Diagnosis not present

## 2019-08-08 DIAGNOSIS — M6281 Muscle weakness (generalized): Secondary | ICD-10-CM | POA: Diagnosis not present

## 2019-08-08 DIAGNOSIS — Z87891 Personal history of nicotine dependence: Secondary | ICD-10-CM | POA: Insufficient documentation

## 2019-08-08 DIAGNOSIS — R079 Chest pain, unspecified: Secondary | ICD-10-CM | POA: Diagnosis not present

## 2019-08-08 DIAGNOSIS — R569 Unspecified convulsions: Secondary | ICD-10-CM | POA: Diagnosis not present

## 2019-08-08 DIAGNOSIS — R0789 Other chest pain: Secondary | ICD-10-CM | POA: Diagnosis not present

## 2019-08-08 DIAGNOSIS — R930 Abnormal findings on diagnostic imaging of skull and head, not elsewhere classified: Secondary | ICD-10-CM | POA: Diagnosis not present

## 2019-08-08 DIAGNOSIS — Z20822 Contact with and (suspected) exposure to covid-19: Secondary | ICD-10-CM | POA: Diagnosis not present

## 2019-08-08 DIAGNOSIS — Z96643 Presence of artificial hip joint, bilateral: Secondary | ICD-10-CM | POA: Insufficient documentation

## 2019-08-08 DIAGNOSIS — R1030 Lower abdominal pain, unspecified: Secondary | ICD-10-CM | POA: Diagnosis not present

## 2019-08-08 LAB — URINALYSIS, ROUTINE W REFLEX MICROSCOPIC
Bilirubin Urine: NEGATIVE
Glucose, UA: NEGATIVE mg/dL
Hgb urine dipstick: NEGATIVE
Ketones, ur: NEGATIVE mg/dL
Nitrite: NEGATIVE
Protein, ur: NEGATIVE mg/dL
Specific Gravity, Urine: 1.014 (ref 1.005–1.030)
pH: 5 (ref 5.0–8.0)

## 2019-08-08 LAB — CBC WITH DIFFERENTIAL/PLATELET
Abs Immature Granulocytes: 0.09 10*3/uL — ABNORMAL HIGH (ref 0.00–0.07)
Basophils Absolute: 0 10*3/uL (ref 0.0–0.1)
Basophils Relative: 0 %
Eosinophils Absolute: 0.1 10*3/uL (ref 0.0–0.5)
Eosinophils Relative: 1 %
HCT: 39.8 % (ref 39.0–52.0)
Hemoglobin: 13 g/dL (ref 13.0–17.0)
Immature Granulocytes: 1 %
Lymphocytes Relative: 10 %
Lymphs Abs: 1 10*3/uL (ref 0.7–4.0)
MCH: 31.3 pg (ref 26.0–34.0)
MCHC: 32.7 g/dL (ref 30.0–36.0)
MCV: 95.7 fL (ref 80.0–100.0)
Monocytes Absolute: 0.9 10*3/uL (ref 0.1–1.0)
Monocytes Relative: 9 %
Neutro Abs: 7.5 10*3/uL (ref 1.7–7.7)
Neutrophils Relative %: 79 %
Platelets: 154 10*3/uL (ref 150–400)
RBC: 4.16 MIL/uL — ABNORMAL LOW (ref 4.22–5.81)
RDW: 14.1 % (ref 11.5–15.5)
WBC: 9.5 10*3/uL (ref 4.0–10.5)
nRBC: 0 % (ref 0.0–0.2)

## 2019-08-08 LAB — COMPREHENSIVE METABOLIC PANEL
ALT: 97 U/L — ABNORMAL HIGH (ref 0–44)
AST: 50 U/L — ABNORMAL HIGH (ref 15–41)
Albumin: 3.6 g/dL (ref 3.5–5.0)
Alkaline Phosphatase: 166 U/L — ABNORMAL HIGH (ref 38–126)
Anion gap: 10 (ref 5–15)
BUN: 18 mg/dL (ref 8–23)
CO2: 23 mmol/L (ref 22–32)
Calcium: 8.7 mg/dL — ABNORMAL LOW (ref 8.9–10.3)
Chloride: 105 mmol/L (ref 98–111)
Creatinine, Ser: 1.25 mg/dL — ABNORMAL HIGH (ref 0.61–1.24)
GFR calc Af Amer: >60 mL/min (ref >60)
GFR calc non Af Amer: 57 mL/min — ABNORMAL LOW (ref >60)
Glucose, Bld: 108 mg/dL — ABNORMAL HIGH (ref 70–99)
Potassium: 3.9 mmol/L (ref 3.5–5.1)
Sodium: 138 mmol/L (ref 135–145)
Total Bilirubin: 1 mg/dL (ref 0.3–1.2)
Total Protein: 6.9 g/dL (ref 6.5–8.1)

## 2019-08-08 LAB — TROPONIN I (HIGH SENSITIVITY)
Troponin I (High Sensitivity): 5 ng/L (ref <18)
Troponin I (High Sensitivity): 6 ng/L (ref <18)

## 2019-08-08 LAB — RESPIRATORY PANEL BY RT PCR (FLU A&B, COVID)
Influenza A by PCR: NEGATIVE
Influenza B by PCR: NEGATIVE
SARS Coronavirus 2 by RT PCR: NEGATIVE

## 2019-08-08 NOTE — ED Notes (Signed)
Patient's IV removed. Patient given discharge instructions.

## 2019-08-08 NOTE — Discharge Instructions (Addendum)
You have been seen in the emergency department today for a likely seizure.  Your workup today including labs are within normal limits.  Please follow up with your doctor as soon as possible regarding today's emergency department visit and your likely seizure.  You will also need to follow up with a neurologist as soon as possible, please call for appointment.  If you have been prescribed a medication for your seizures, please take this medication as prescribed.  As we have discussed it is very important that you DO NOT drive until you have been seen and cleared by your neurologist.  Please drink plenty of fluids, get plenty of sleep and avoid any alcohol or drug use.  Return to the emergency department if you have any further seizures, develop any weakness/numbness of any arm/leg, confusion, slurred speech, or sudden/severe headache.

## 2019-08-08 NOTE — ED Provider Notes (Signed)
Emergency Department Provider Note   I have reviewed the triage vital signs and the nursing notes.   HISTORY  Chief Complaint Seizures   HPI Bernard Roy is a 74 y.o. male with PMH of HTN, HLD, prostate cancer history, and chronic back pain presents to the emergency department from his primary care doctor's office after having what appeared to be a seizure.  Patient was visiting with his PCP, Dr. Nevada Roy, when he suddenly became unresponsive.  He was noted to have some shaking in his right arm and left leg according to the family member at bedside.  EMS also report this.  The event lasted approximately 30 seconds and then resolved.  The patient remembers going to speak with his PCP in the next recalls the ambulance showing up.  He does not recall the event itself.  He had not eaten today in anticipation of doing fasting blood work but EMS reports a blood sugar of 120 on scene.  He has had some right-sided chest discomfort which is been mostly constant over the past several months but patient denies any worsening pain today.  No shortness of breath or heart palpitations.  No fevers.  No changes to medications.  He has some ongoing lower abdominal pain which is more chronic and not suddenly changing or worsening.  He also has chronic back pain which is unchanged.  He has no prior history of seizure. No urinary incontinence or tongue pain/biting.   Past Medical History:  Diagnosis Date  . Chronic back pain   . Detached retina   . Glaucoma   . Hip pain   . HTN (hypertension)   . Hypercholesterolemia   . Memory loss    per sister. Not officially diagnosed with dementia. Able to remember short term and Bernard Roy term but sometimes mentions things that may not have happened and sister does not recognize  . Prostate cancer ([place])    recurrent. Radiation with first episode.     Patient Active Problem List   Diagnosis Date Noted  . History of colonic polyps 07/21/2019  . Elevated LFTs 07/21/2019     Past Surgical History:  Procedure Laterality Date  . COLONOSCOPY  2016   5 sessile polyps in ascending colon, 3 sessile polyps in transverse colon, 5 sessile polyps in sigmoid, and sigmoid diverticulosis. Path unknown  . TOTAL HIP ARTHROPLASTY     bilateral hips, X 2    Allergies Patient has no known allergies.  Family History  Problem Relation Age of Onset  . Colon cancer Neg Hx   . Liver disease Neg Hx     Social History Social History   Tobacco Use  . Smoking status: Former Research scientist (life sciences)  . Smokeless tobacco: Never Used  Substance Use Topics  . Alcohol use: Not Currently    Comment: in the past  . Drug use: Never    Review of Systems  Constitutional: No fever/chills Eyes: No visual changes. ENT: No sore throat. Cardiovascular: Positive chest pain for the last several months.  Respiratory: Denies shortness of breath. Gastrointestinal: No abdominal pain.  No nausea, no vomiting.  No diarrhea.  No constipation. Genitourinary: Negative for dysuria. Musculoskeletal: Chronic back pain. Skin: Negative for rash. Neurological: Negative for headaches, focal weakness or numbness. Positive seizure like activity today.   10-point ROS otherwise negative.  ____________________________________________   PHYSICAL EXAM:  VITAL SIGNS: ED Triage Vitals  Enc Vitals Group     BP 08/08/19 1551 128/70     Pulse Rate  08/08/19 1551 69     Resp 08/08/19 1551 18     Temp 08/08/19 1551 98.3 F (36.8 C)     Temp Source 08/08/19 1551 Oral     SpO2 08/08/19 1551 98 %     Weight 08/08/19 1552 280 lb (127 kg)     Height 08/08/19 1552 _0  (1.88 m)   Constitutional: Alert and oriented. Well appearing and in no acute distress. Eyes: Conjunctivae are normal. PERRL.  Head: Atraumatic. Nose: No congestion/rhinnorhea. Mouth/Throat: Mucous membranes are moist.   Neck: No stridor.  Cardiovascular: Normal rate, regular rhythm. Good peripheral circulation. Grossly normal heart sounds.    Respiratory: Normal respiratory effort.  No retractions. Lungs CTAB. Gastrointestinal: Soft and nontender. No distention.  Musculoskeletal: No gross deformities of extremities. Neurologic:  Normal speech and language. No gross focal neurologic deficits are appreciated.  Skin:  Skin is warm, dry and intact. No rash noted.  ____________________________________________   LABS (all labs ordered are listed, but only abnormal results are displayed)  Labs Reviewed  COMPREHENSIVE METABOLIC PANEL - Abnormal; Notable for the following components:      Result Value   Glucose, Bld 108 (*)    Creatinine, Ser 1.25 (*)    Calcium 8.7 (*)    AST 50 (*)    ALT 97 (*)    Alkaline Phosphatase 166 (*)    GFR calc non Af Amer 57 (*)    All other components within normal limits  CBC WITH DIFFERENTIAL/PLATELET - Abnormal; Notable for the following components:   RBC 4.16 (*)    Abs Immature Granulocytes 0.09 (*)    All other components within normal limits  URINALYSIS, ROUTINE W REFLEX MICROSCOPIC - Abnormal; Notable for the following components:   APPearance HAZY (*)    Leukocytes,Ua MODERATE (*)    Bacteria, UA RARE (*)    All other components within normal limits  RESPIRATORY PANEL BY RT PCR (FLU A&B, COVID)  URINE CULTURE  TROPONIN I (HIGH SENSITIVITY)  TROPONIN I (HIGH SENSITIVITY)   ____________________________________________  EKG   EKG Interpretation  Date/Time:  Monday August 08 2019 15:48:06 EDT Ventricular Rate:  71 PR Interval:    QRS Duration: 91 QT Interval:  400 QTC Calculation: 435 R Axis:   -32 Text Interpretation: Sinus rhythm Left axis deviation Abnormal R-wave progression, early transition No STEMI Confirmed by Nanda Quinton [phone]) on 08/08/2019 4:33:49 PM       ____________________________________________  RADIOLOGY  CT Head Wo Contrast  Result Date: 08/08/2019 CLINICAL DATA:  Seizure. EXAM: CT HEAD WITHOUT CONTRAST TECHNIQUE: Contiguous axial images were  obtained from the base of the skull through the vertex without intravenous contrast. COMPARISON:  None. FINDINGS: Brain: Mild chronic ischemic white matter disease is noted. No mass effect or midline shift is noted. Ventricular size is within normal limits. There is no evidence of mass lesion, hemorrhage or acute infarction. Vascular: No hyperdense vessel or unexpected calcification. Skull: Normal. Negative for fracture or focal lesion. Sinuses/Orbits: No acute finding. Other: None. IMPRESSION: Mild chronic ischemic white matter disease. No acute intracranial abnormality seen. Electronically Signed   By: Marijo Conception M.D.   On: 08/08/2019 17:59   DG Chest Portable 1 View  Result Date: 08/08/2019 CLINICAL DATA:  Chest pain with weakness today EXAM: PORTABLE CHEST 1 VIEW COMPARISON:  None. FINDINGS: The heart size and mediastinal contours are likely within normal limits for AP portable technique. The lungs are clear. No pneumothorax or large pleural effusion. Degenerative changes  are noted in the right shoulder. IMPRESSION: Limited AP portable film. No definite acute cardiopulmonary finding. Electronically Signed   By: Audie Pinto M.D.   On: 08/08/2019 17:22    ____________________________________________   PROCEDURES  Procedure(s) performed:   Procedures  None ____________________________________________   INITIAL IMPRESSION / ASSESSMENT AND PLAN / ED COURSE  Pertinent labs & imaging results that were available during my care of the patient were reviewed by me and considered in my medical decision making (see chart for details).   Patient presents emergency department with seizure-like activity while at his PCPs office today.  It was witnessed and lasted approximately 30 seconds with primarily right-sided arm shaking with staring off.  Patient was seated in his Rollator at the time and did not fall.  No stigmata of seizure on my exam.  Patient without focal neurologic deficit.  He  does have a past history of prostate cancer.  Plan for CT imaging of the head along with chest x-ray and lab work.  Patient's right-sided chest pain has been ongoing for the last several months and is unchanged. No active pain.  EKG interpreted as above with no acute ischemic findings or arrhythmia.   On reevaluation patient is awake and alert.  He is eating and drinking without difficulty and feeling well.  His lab work shows mild elevation in his LFTs and alk phos with normal bilirubin.  Patient has no abdominal pain or tenderness on exam.  No vomiting.  Initial troponin is 5.  Will repeat for trending.  CT imaging of the head and CXR reviewed with no acute findings.   Second troponin unchanged. Remains awake and alert. Eating without difficulty. Discussed results with patient and sister at bedside. Will have patient see local Neurology as an outpatient. Provided contact info and patient to call in the AM. Discussed NO DRIVING precautions with the patient and provided in writing. Discussed ED return precautions.   ____________________________________________  FINAL CLINICAL IMPRESSION(S) / ED DIAGNOSES  Final diagnoses:  Seizure-like activity ([place])    Note:  This document was prepared using Dragon voice recognition software and may include unintentional dictation errors.  Nanda Quinton, MD, [facility] Emergency Medicine    Shereece Wellborn, [facility], MD 08/09/19 [phone]

## 2019-08-08 NOTE — ED Triage Notes (Signed)
Pt went in for review of blood work. While sitting with Dr Nevada Crane pt became unresponsive and began shaking on the right side approx 30 sec. EMS reports that pt was sitting up and alert when they arrived. No incontinent episode . Pt does not remember episode. CBG 120 in office. Pt hasnt eaten today

## 2019-08-09 LAB — URINE CULTURE: Culture: <10000 — AB

## 2019-08-11 ENCOUNTER — Other Ambulatory Visit (HOSPITAL_COMMUNITY): Payer: Self-pay | Admitting: Radiology

## 2019-08-11 DIAGNOSIS — C61 Malignant neoplasm of prostate: Secondary | ICD-10-CM

## 2019-08-12 ENCOUNTER — Encounter (HOSPITAL_COMMUNITY): Admission: RE | Admit: 2019-08-12 | Payer: No Typology Code available for payment source | Source: Ambulatory Visit

## 2019-08-23 ENCOUNTER — Other Ambulatory Visit: Payer: Self-pay

## 2019-08-23 ENCOUNTER — Ambulatory Visit (HOSPITAL_COMMUNITY)
Admission: RE | Admit: 2019-08-23 | Discharge: 2019-08-23 | Disposition: A | Discharge status: Home / Self Care | Payer: No Typology Code available for payment source | Source: Ambulatory Visit | Attending: Radiology | Admitting: Radiology

## 2019-08-23 DIAGNOSIS — C61 Malignant neoplasm of prostate: Secondary | ICD-10-CM | POA: Insufficient documentation

## 2019-08-23 MED ORDER — AXUMIN (FLUCICLOVINE F 18) INJECTION
10.2300 | Freq: Once | INTRAVENOUS | Status: AC
  Administered 2019-08-23: 15:00:00 10.23 via INTRAVENOUS

## 2019-09-06 ENCOUNTER — Telehealth: Payer: Self-pay | Admitting: *Deleted

## 2019-09-06 NOTE — Telephone Encounter (Signed)
-----   Message from Daneil Dolin, MD sent at 09/06/2019 10:21 AM EDT ----- Regarding: RE: chart review Recent acute illness. Put TCS on hold. Once he has had seizure work-up and has seen the cardiologist he can then return to the office and be seen in person to decide on his candidacy for colonoscopy. ----- Message ----- From: Encarnacion Chu, RN Sent: 09/06/2019   9:31 AM EDT To: Daneil Dolin, MD Subject: chart review                                   Good morning Dr Gala Romney. When you get a chance, will you review Faizon Rumbaugh recent visit to ED (08/08/2019). According to note, he had seizure like activity while in Dr Juel Burrow office and reported some chest discomfort that has been on going for a while. I dont see any mention of follow up for seizure activity or cardiology consult. Are you alright with proceeding with TCS as he stands medically right now? Thank You.

## 2019-09-06 NOTE — Telephone Encounter (Signed)
Called patient and is aware of putting TCS on hold and he will need seizure work up and has seen cards. He will need OV for f/u to get rescheduled.  Called endo and LMOVM making aware to cancel.

## 2019-09-07 DIAGNOSIS — I10 Essential (primary) hypertension: Secondary | ICD-10-CM | POA: Diagnosis not present

## 2019-09-07 DIAGNOSIS — M6281 Muscle weakness (generalized): Secondary | ICD-10-CM | POA: Diagnosis not present

## 2019-09-09 ENCOUNTER — Encounter (HOSPITAL_COMMUNITY): Admission: RE | Admit: 2019-09-09 | Payer: No Typology Code available for payment source | Source: Ambulatory Visit

## 2019-09-09 ENCOUNTER — Other Ambulatory Visit (HOSPITAL_COMMUNITY): Payer: No Typology Code available for payment source

## 2019-09-13 ENCOUNTER — Ambulatory Visit (HOSPITAL_COMMUNITY)
Admission: RE | Admit: 2019-09-13 | Payer: No Typology Code available for payment source | Source: Home / Self Care | Admitting: Internal Medicine

## 2019-09-13 ENCOUNTER — Encounter (HOSPITAL_COMMUNITY): Admission: RE | Payer: Self-pay | Source: Home / Self Care

## 2019-09-13 SURGERY — COLONOSCOPY WITH PROPOFOL
Anesthesia: Monitor Anesthesia Care

## 2019-09-19 ENCOUNTER — Encounter: Payer: Self-pay | Admitting: Neurology

## 2019-09-27 ENCOUNTER — Ambulatory Visit: Payer: No Typology Code available for payment source | Admitting: Neurology

## 2019-10-08 DIAGNOSIS — I10 Essential (primary) hypertension: Secondary | ICD-10-CM | POA: Diagnosis not present

## 2019-10-08 DIAGNOSIS — M6281 Muscle weakness (generalized): Secondary | ICD-10-CM | POA: Diagnosis not present

## 2019-10-24 ENCOUNTER — Encounter: Payer: Self-pay | Admitting: Gastroenterology

## 2019-10-24 NOTE — Progress Notes (Signed)
Outside labs from PCP dated April 2021:  AST 33, ALT 56, Alk Phos 228.   He has had chronically elevated alk phos without further evaluation.   Can we see how he is doing? He was inpatient after the visit with me for seizure-like activity. Ultimately recommend further serologies, but it also looks like he is being evaluated for metastatic prostate cancer after review of PET May 2021.

## 2019-10-24 NOTE — Progress Notes (Signed)
Spoke with pt. Pt states that he feels pretty good in his body. Pt says he is just leaving the [place], [place] due to receiving treatments for the prostate cancer.

## 2019-10-27 DIAGNOSIS — R945 Abnormal results of liver function studies: Secondary | ICD-10-CM | POA: Diagnosis not present

## 2019-10-27 DIAGNOSIS — M545 Low back pain: Secondary | ICD-10-CM | POA: Diagnosis not present

## 2019-10-27 DIAGNOSIS — E782 Mixed hyperlipidemia: Secondary | ICD-10-CM | POA: Diagnosis not present

## 2019-10-27 DIAGNOSIS — H409 Unspecified glaucoma: Secondary | ICD-10-CM | POA: Diagnosis not present

## 2019-10-27 DIAGNOSIS — G4700 Insomnia, unspecified: Secondary | ICD-10-CM | POA: Diagnosis not present

## 2019-10-27 DIAGNOSIS — N1831 Chronic kidney disease, stage 3a: Secondary | ICD-10-CM | POA: Diagnosis not present

## 2019-10-27 DIAGNOSIS — R7301 Impaired fasting glucose: Secondary | ICD-10-CM | POA: Diagnosis not present

## 2019-10-27 DIAGNOSIS — R7303 Prediabetes: Secondary | ICD-10-CM | POA: Diagnosis not present

## 2019-10-27 DIAGNOSIS — I129 Hypertensive chronic kidney disease with stage 1 through stage 4 chronic kidney disease, or unspecified chronic kidney disease: Secondary | ICD-10-CM | POA: Diagnosis not present

## 2019-11-07 DIAGNOSIS — I10 Essential (primary) hypertension: Secondary | ICD-10-CM | POA: Diagnosis not present

## 2019-11-07 DIAGNOSIS — M6281 Muscle weakness (generalized): Secondary | ICD-10-CM | POA: Diagnosis not present

## 2019-11-16 NOTE — Progress Notes (Signed)
Lmom, waiting on a return call.  

## 2019-11-16 NOTE — Progress Notes (Signed)
If he is willing to do additional labs, we can order them. Otherwise, definitely needs routine follow-up appt with him to wrap up loose ends.

## 2019-11-17 ENCOUNTER — Telehealth: Payer: Self-pay | Admitting: Emergency Medicine

## 2019-11-17 NOTE — Telephone Encounter (Signed)
See last note made. Per RMR patient will need OV to get rescheduled. Please schedule patient OV and mail letter. thanks

## 2019-11-17 NOTE — Telephone Encounter (Signed)
[facility]ants to know if [facility] can be scheduled for his procedure. They stated they faxed over clearance for pt on 11/02/19. [facility] rep stated she will refax those documents and to please advise once they have been reviewed.   [phone] ext [phone]

## 2019-11-22 NOTE — Progress Notes (Signed)
Spoke with pt. Pt is ok to have blood work. Please advise on labs needed. Pt will be have [facility] completed at [facility].

## 2019-11-23 ENCOUNTER — Encounter: Payer: Self-pay | Admitting: Internal Medicine

## 2019-11-23 NOTE — Telephone Encounter (Signed)
Patient has OV scheduled for 01/25/2020

## 2019-12-08 DIAGNOSIS — M6281 Muscle weakness (generalized): Secondary | ICD-10-CM | POA: Diagnosis not present

## 2019-12-08 DIAGNOSIS — I10 Essential (primary) hypertension: Secondary | ICD-10-CM | POA: Diagnosis not present

## 2019-12-09 ENCOUNTER — Encounter: Payer: Self-pay | Admitting: Internal Medicine

## 2019-12-13 DIAGNOSIS — Z743 Need for continuous supervision: Secondary | ICD-10-CM | POA: Diagnosis not present

## 2019-12-13 DIAGNOSIS — R5381 Other malaise: Secondary | ICD-10-CM | POA: Diagnosis not present

## 2019-12-16 DIAGNOSIS — I129 Hypertensive chronic kidney disease with stage 1 through stage 4 chronic kidney disease, or unspecified chronic kidney disease: Secondary | ICD-10-CM | POA: Diagnosis not present

## 2019-12-16 DIAGNOSIS — N189 Chronic kidney disease, unspecified: Secondary | ICD-10-CM | POA: Diagnosis not present

## 2019-12-16 DIAGNOSIS — R972 Elevated prostate specific antigen [PSA]: Secondary | ICD-10-CM | POA: Diagnosis not present

## 2019-12-16 DIAGNOSIS — M545 Low back pain: Secondary | ICD-10-CM | POA: Diagnosis not present

## 2019-12-16 DIAGNOSIS — R9721 Rising PSA following treatment for malignant neoplasm of prostate: Secondary | ICD-10-CM | POA: Diagnosis not present

## 2019-12-16 DIAGNOSIS — R945 Abnormal results of liver function studies: Secondary | ICD-10-CM | POA: Diagnosis not present

## 2019-12-16 DIAGNOSIS — M160 Bilateral primary osteoarthritis of hip: Secondary | ICD-10-CM | POA: Diagnosis not present

## 2020-01-08 DIAGNOSIS — M6281 Muscle weakness (generalized): Secondary | ICD-10-CM | POA: Diagnosis not present

## 2020-01-08 DIAGNOSIS — I10 Essential (primary) hypertension: Secondary | ICD-10-CM | POA: Diagnosis not present

## 2020-01-17 ENCOUNTER — Telehealth: Payer: Self-pay | Admitting: Internal Medicine

## 2020-01-17 NOTE — Telephone Encounter (Signed)
Lmom, waiting on a return call.  

## 2020-01-17 NOTE — Telephone Encounter (Signed)
We received referral from [place] and patient is scheduled OV with [place] on 01/25/2020. His wife is wanting to know if we need anything from his doctor regarding his seizures. Please advise. [phone]

## 2020-01-18 NOTE — Telephone Encounter (Signed)
Pts sister returned call. Pt will be given any information needed for his upcoming procedure at his appointment. If pt needs clearance from a physician to have the procedure, pts sister is aware that our office will contact that office for clearance.

## 2020-01-25 ENCOUNTER — Ambulatory Visit: Payer: No Typology Code available for payment source | Admitting: Gastroenterology

## 2020-02-07 DIAGNOSIS — I10 Essential (primary) hypertension: Secondary | ICD-10-CM | POA: Diagnosis not present

## 2020-02-07 DIAGNOSIS — M6281 Muscle weakness (generalized): Secondary | ICD-10-CM | POA: Diagnosis not present

## 2020-03-09 DIAGNOSIS — I10 Essential (primary) hypertension: Secondary | ICD-10-CM | POA: Diagnosis not present

## 2020-03-09 DIAGNOSIS — M6281 Muscle weakness (generalized): Secondary | ICD-10-CM | POA: Diagnosis not present

## 2020-03-20 ENCOUNTER — Ambulatory Visit: Payer: No Typology Code available for payment source | Admitting: Gastroenterology

## 2020-04-08 DIAGNOSIS — M6281 Muscle weakness (generalized): Secondary | ICD-10-CM | POA: Diagnosis not present

## 2020-04-08 DIAGNOSIS — I10 Essential (primary) hypertension: Secondary | ICD-10-CM | POA: Diagnosis not present

## 2020-05-09 ENCOUNTER — Inpatient Hospital Stay (HOSPITAL_COMMUNITY): Payer: No Typology Code available for payment source

## 2020-05-09 ENCOUNTER — Emergency Department (HOSPITAL_COMMUNITY): Payer: No Typology Code available for payment source

## 2020-05-09 ENCOUNTER — Other Ambulatory Visit: Payer: Self-pay

## 2020-05-09 ENCOUNTER — Inpatient Hospital Stay (HOSPITAL_COMMUNITY)
Admission: EM | Admit: 2020-05-09 | Discharge: 2020-05-19 | DRG: 915 | Disposition: A | Discharge status: Skilled Nursing Facility | Payer: No Typology Code available for payment source | Attending: Internal Medicine | Admitting: Internal Medicine

## 2020-05-09 DIAGNOSIS — I129 Hypertensive chronic kidney disease with stage 1 through stage 4 chronic kidney disease, or unspecified chronic kidney disease: Secondary | ICD-10-CM | POA: Diagnosis not present

## 2020-05-09 DIAGNOSIS — R972 Elevated prostate specific antigen [PSA]: Secondary | ICD-10-CM | POA: Diagnosis not present

## 2020-05-09 DIAGNOSIS — M2550 Pain in unspecified joint: Secondary | ICD-10-CM | POA: Diagnosis not present

## 2020-05-09 DIAGNOSIS — L89322 Pressure ulcer of left buttock, stage 2: Secondary | ICD-10-CM | POA: Diagnosis present

## 2020-05-09 DIAGNOSIS — I5030 Unspecified diastolic (congestive) heart failure: Secondary | ICD-10-CM | POA: Diagnosis not present

## 2020-05-09 DIAGNOSIS — H409 Unspecified glaucoma: Secondary | ICD-10-CM | POA: Diagnosis present

## 2020-05-09 DIAGNOSIS — J90 Pleural effusion, not elsewhere classified: Secondary | ICD-10-CM | POA: Diagnosis not present

## 2020-05-09 DIAGNOSIS — Z87891 Personal history of nicotine dependence: Secondary | ICD-10-CM

## 2020-05-09 DIAGNOSIS — I10 Essential (primary) hypertension: Secondary | ICD-10-CM

## 2020-05-09 DIAGNOSIS — M160 Bilateral primary osteoarthritis of hip: Secondary | ICD-10-CM | POA: Diagnosis not present

## 2020-05-09 DIAGNOSIS — T464X5A Adverse effect of angiotensin-converting-enzyme inhibitors, initial encounter: Secondary | ICD-10-CM | POA: Diagnosis present

## 2020-05-09 DIAGNOSIS — Z79899 Other long term (current) drug therapy: Secondary | ICD-10-CM | POA: Diagnosis not present

## 2020-05-09 DIAGNOSIS — Z978 Presence of other specified devices: Secondary | ICD-10-CM

## 2020-05-09 DIAGNOSIS — I110 Hypertensive heart disease with heart failure: Secondary | ICD-10-CM | POA: Diagnosis present

## 2020-05-09 DIAGNOSIS — Z96643 Presence of artificial hip joint, bilateral: Secondary | ICD-10-CM | POA: Diagnosis present

## 2020-05-09 DIAGNOSIS — Z8546 Personal history of malignant neoplasm of prostate: Secondary | ICD-10-CM | POA: Diagnosis not present

## 2020-05-09 DIAGNOSIS — E7800 Pure hypercholesterolemia, unspecified: Secondary | ICD-10-CM | POA: Diagnosis present

## 2020-05-09 DIAGNOSIS — R230 Cyanosis: Secondary | ICD-10-CM | POA: Diagnosis present

## 2020-05-09 DIAGNOSIS — Z8719 Personal history of other diseases of the digestive system: Secondary | ICD-10-CM | POA: Diagnosis not present

## 2020-05-09 DIAGNOSIS — T783XXA Angioneurotic edema, initial encounter: Principal | ICD-10-CM | POA: Diagnosis present

## 2020-05-09 DIAGNOSIS — L8990 Pressure ulcer of unspecified site, unspecified stage: Secondary | ICD-10-CM | POA: Insufficient documentation

## 2020-05-09 DIAGNOSIS — N5089 Other specified disorders of the male genital organs: Secondary | ICD-10-CM | POA: Diagnosis present

## 2020-05-09 DIAGNOSIS — R413 Other amnesia: Secondary | ICD-10-CM | POA: Diagnosis present

## 2020-05-09 DIAGNOSIS — T782XXA Anaphylactic shock, unspecified, initial encounter: Secondary | ICD-10-CM | POA: Diagnosis not present

## 2020-05-09 DIAGNOSIS — J988 Other specified respiratory disorders: Secondary | ICD-10-CM | POA: Diagnosis not present

## 2020-05-09 DIAGNOSIS — I5033 Acute on chronic diastolic (congestive) heart failure: Secondary | ICD-10-CM | POA: Diagnosis present

## 2020-05-09 DIAGNOSIS — Z23 Encounter for immunization: Secondary | ICD-10-CM | POA: Diagnosis not present

## 2020-05-09 DIAGNOSIS — E785 Hyperlipidemia, unspecified: Secondary | ICD-10-CM | POA: Diagnosis present

## 2020-05-09 DIAGNOSIS — Z7401 Bed confinement status: Secondary | ICD-10-CM | POA: Diagnosis not present

## 2020-05-09 DIAGNOSIS — Z20822 Contact with and (suspected) exposure to covid-19: Secondary | ICD-10-CM | POA: Diagnosis present

## 2020-05-09 DIAGNOSIS — E119 Type 2 diabetes mellitus without complications: Secondary | ICD-10-CM

## 2020-05-09 DIAGNOSIS — R945 Abnormal results of liver function studies: Secondary | ICD-10-CM | POA: Diagnosis not present

## 2020-05-09 DIAGNOSIS — R220 Localized swelling, mass and lump, head: Secondary | ICD-10-CM | POA: Diagnosis not present

## 2020-05-09 DIAGNOSIS — J9601 Acute respiratory failure with hypoxia: Secondary | ICD-10-CM | POA: Diagnosis present

## 2020-05-09 DIAGNOSIS — R369 Urethral discharge, unspecified: Secondary | ICD-10-CM | POA: Diagnosis not present

## 2020-05-09 DIAGNOSIS — R680 Hypothermia, not associated with low environmental temperature: Secondary | ICD-10-CM | POA: Diagnosis not present

## 2020-05-09 DIAGNOSIS — T7840XA Allergy, unspecified, initial encounter: Secondary | ICD-10-CM | POA: Diagnosis not present

## 2020-05-09 DIAGNOSIS — J9811 Atelectasis: Secondary | ICD-10-CM | POA: Diagnosis not present

## 2020-05-09 DIAGNOSIS — R609 Edema, unspecified: Secondary | ICD-10-CM | POA: Diagnosis not present

## 2020-05-09 DIAGNOSIS — Z0189 Encounter for other specified special examinations: Secondary | ICD-10-CM

## 2020-05-09 DIAGNOSIS — N179 Acute kidney failure, unspecified: Secondary | ICD-10-CM | POA: Diagnosis present

## 2020-05-09 DIAGNOSIS — I517 Cardiomegaly: Secondary | ICD-10-CM | POA: Diagnosis not present

## 2020-05-09 DIAGNOSIS — T783XXS Angioneurotic edema, sequela: Secondary | ICD-10-CM | POA: Diagnosis not present

## 2020-05-09 DIAGNOSIS — G8929 Other chronic pain: Secondary | ICD-10-CM | POA: Diagnosis present

## 2020-05-09 DIAGNOSIS — L509 Urticaria, unspecified: Secondary | ICD-10-CM | POA: Diagnosis not present

## 2020-05-09 DIAGNOSIS — M6281 Muscle weakness (generalized): Secondary | ICD-10-CM | POA: Diagnosis not present

## 2020-05-09 DIAGNOSIS — R339 Retention of urine, unspecified: Secondary | ICD-10-CM | POA: Diagnosis present

## 2020-05-09 DIAGNOSIS — T380X5A Adverse effect of glucocorticoids and synthetic analogues, initial encounter: Secondary | ICD-10-CM | POA: Diagnosis present

## 2020-05-09 LAB — BLOOD GAS, ARTERIAL
Acid-Base Excess: 2.4 mmol/L — ABNORMAL HIGH (ref 0.0–2.0)
Bicarbonate: 26.8 mmol/L (ref 20.0–28.0)
Drawn by: 38235
FIO2: 100
MECHVT: 650 mL
O2 Saturation: 99.3 %
PEEP: 5 cmH2O
Patient temperature: 37
RATE: 16 resp/min
pCO2 arterial: 36 mmHg (ref 32.0–48.0)
pH, Arterial: 7.47 — ABNORMAL HIGH (ref 7.350–7.450)
pO2, Arterial: 273 mmHg — ABNORMAL HIGH (ref 83.0–108.0)

## 2020-05-09 LAB — BASIC METABOLIC PANEL
Anion gap: 8 (ref 5–15)
BUN: 14 mg/dL (ref 8–23)
CO2: 27 mmol/L (ref 22–32)
Calcium: 9.2 mg/dL (ref 8.9–10.3)
Chloride: 103 mmol/L (ref 98–111)
Creatinine, Ser: 0.77 mg/dL (ref 0.61–1.24)
GFR, Estimated: >60 mL/min (ref >60)
Glucose, Bld: 143 mg/dL — ABNORMAL HIGH (ref 70–99)
Potassium: 4.2 mmol/L (ref 3.5–5.1)
Sodium: 138 mmol/L (ref 135–145)

## 2020-05-09 LAB — CBC
HCT: 39.5 % (ref 39.0–52.0)
Hemoglobin: 12.5 g/dL — ABNORMAL LOW (ref 13.0–17.0)
MCH: 29.8 pg (ref 26.0–34.0)
MCHC: 31.6 g/dL (ref 30.0–36.0)
MCV: 94.3 fL (ref 80.0–100.0)
Platelets: 285 10*3/uL (ref 150–400)
RBC: 4.19 MIL/uL — ABNORMAL LOW (ref 4.22–5.81)
RDW: 13 % (ref 11.5–15.5)
WBC: 14.3 10*3/uL — ABNORMAL HIGH (ref 4.0–10.5)
nRBC: 0 % (ref 0.0–0.2)

## 2020-05-09 MED ORDER — FAMOTIDINE IN NACL 20-0.9 MG/50ML-% IV SOLN
20.0000 mg | INTRAVENOUS | Status: DC
  Administered 2020-05-10 – 2020-05-11 (×2): 20 mg via INTRAVENOUS
  Filled 2020-05-09 (×2): qty 50

## 2020-05-09 MED ORDER — ENOXAPARIN SODIUM 40 MG/0.4ML ~~LOC~~ SOLN
40.0000 mg | Freq: Every day | SUBCUTANEOUS | Status: DC
  Administered 2020-05-09 – 2020-05-10 (×2): 40 mg via SUBCUTANEOUS
  Filled 2020-05-09 (×2): qty 0.4

## 2020-05-09 MED ORDER — LABETALOL HCL 5 MG/ML IV SOLN
10.0000 mg | INTRAVENOUS | Status: DC | PRN
  Administered 2020-05-13: 10 mg via INTRAVENOUS
  Filled 2020-05-09: qty 4

## 2020-05-09 MED ORDER — DIPHENHYDRAMINE HCL 50 MG/ML IJ SOLN
50.0000 mg | Freq: Three times a day (TID) | INTRAMUSCULAR | Status: AC
  Administered 2020-05-10 (×2): 50 mg via INTRAVENOUS
  Filled 2020-05-09 (×2): qty 1

## 2020-05-09 MED ORDER — ACETAMINOPHEN 325 MG PO TABS
650.0000 mg | ORAL_TABLET | Freq: Four times a day (QID) | ORAL | Status: DC | PRN
  Administered 2020-05-15 – 2020-05-18 (×3): 650 mg via ORAL
  Filled 2020-05-09 (×3): qty 2

## 2020-05-09 MED ORDER — ETOMIDATE 2 MG/ML IV SOLN
INTRAVENOUS | Status: AC | PRN
  Administered 2020-05-09: 30 mg via INTRAVENOUS

## 2020-05-09 MED ORDER — POLYETHYLENE GLYCOL 3350 17 G PO PACK
17.0000 g | PACK | Freq: Every day | ORAL | Status: DC
  Administered 2020-05-10 – 2020-05-15 (×5): 17 g
  Filled 2020-05-09 (×5): qty 1

## 2020-05-09 MED ORDER — SODIUM CHLORIDE 0.9 % IV SOLN: INTRAVENOUS | Status: DC

## 2020-05-09 MED ORDER — METHYLPREDNISOLONE SODIUM SUCC 125 MG IJ SOLR
80.0000 mg | Freq: Two times a day (BID) | INTRAMUSCULAR | Status: DC
  Administered 2020-05-10 – 2020-05-12 (×5): 80 mg via INTRAVENOUS
  Filled 2020-05-09 (×5): qty 2

## 2020-05-09 MED ORDER — ONDANSETRON HCL 4 MG/2ML IJ SOLN: 4.0000 mg | Freq: Four times a day (QID) | INTRAMUSCULAR | Status: DC | PRN

## 2020-05-09 MED ORDER — DOCUSATE SODIUM 50 MG/5ML PO LIQD
100.0000 mg | Freq: Two times a day (BID) | ORAL | Status: DC
  Administered 2020-05-10 – 2020-05-17 (×11): 100 mg
  Filled 2020-05-09 (×11): qty 10

## 2020-05-09 MED ORDER — PROPOFOL 1000 MG/100ML IV EMUL
INTRAVENOUS | Status: AC
  Filled 2020-05-09: qty 100

## 2020-05-09 MED ORDER — ONDANSETRON HCL 4 MG PO TABS: 4.0000 mg | ORAL_TABLET | Freq: Four times a day (QID) | ORAL | Status: DC | PRN

## 2020-05-09 MED ORDER — ROCURONIUM BROMIDE 50 MG/5ML IV SOLN
INTRAVENOUS | Status: AC | PRN
  Administered 2020-05-09: 100 mg via INTRAVENOUS

## 2020-05-09 MED ORDER — FAMOTIDINE IN NACL 20-0.9 MG/50ML-% IV SOLN
20.0000 mg | INTRAVENOUS | Status: AC
  Administered 2020-05-09: 20 mg via INTRAVENOUS
  Filled 2020-05-09: qty 50

## 2020-05-09 MED ORDER — METHYLPREDNISOLONE SODIUM SUCC 125 MG IJ SOLR
125.0000 mg | Freq: Once | INTRAMUSCULAR | Status: AC
  Administered 2020-05-09: 125 mg via INTRAVENOUS
  Filled 2020-05-09: qty 2

## 2020-05-09 MED ORDER — POLYETHYLENE GLYCOL 3350 17 G PO PACK
17.0000 g | PACK | Freq: Every day | ORAL | Status: DC | PRN
  Filled 2020-05-09: qty 1

## 2020-05-09 MED ORDER — EPINEPHRINE 0.3 MG/0.3ML IJ SOAJ
0.3000 mg | Freq: Once | INTRAMUSCULAR | Status: AC
  Administered 2020-05-09: 0.3 mg via INTRAMUSCULAR
  Filled 2020-05-09: qty 0.3

## 2020-05-09 MED ORDER — FENTANYL 2500MCG IN NS 250ML (10MCG/ML) PREMIX INFUSION
0.0000 ug/h | INTRAVENOUS | Status: DC
  Administered 2020-05-09: 25 ug/h via INTRAVENOUS
  Administered 2020-05-10: 175 ug/h via INTRAVENOUS
  Administered 2020-05-11: 125 ug/h via INTRAVENOUS
  Administered 2020-05-12: 175 ug/h via INTRAVENOUS
  Filled 2020-05-09 (×4): qty 250

## 2020-05-09 MED ORDER — POVIDONE-IODINE 10 % EX SOLN
CUTANEOUS | Status: AC
  Filled 2020-05-09: qty 45

## 2020-05-09 MED ORDER — SUCCINYLCHOLINE CHLORIDE 20 MG/ML IJ SOLN
INTRAMUSCULAR | Status: AC | PRN
  Administered 2020-05-09: 200 mg via INTRAVENOUS

## 2020-05-09 MED ORDER — DIPHENHYDRAMINE HCL 50 MG/ML IJ SOLN
50.0000 mg | Freq: Once | INTRAMUSCULAR | Status: AC
  Administered 2020-05-09: 50 mg via INTRAVENOUS
  Filled 2020-05-09: qty 1

## 2020-05-09 MED ORDER — ACETAMINOPHEN 650 MG RE SUPP: 650.0000 mg | Freq: Four times a day (QID) | RECTAL | Status: DC | PRN

## 2020-05-09 MED ORDER — PROPOFOL 1000 MG/100ML IV EMUL
INTRAVENOUS | Status: AC | PRN
  Administered 2020-05-09: 10 ug/kg/min via INTRAVENOUS

## 2020-05-09 MED ORDER — PROPOFOL 1000 MG/100ML IV EMUL
5.0000 ug/kg/min | INTRAVENOUS | Status: DC
  Administered 2020-05-09 (×2): 50 ug/kg/min via INTRAVENOUS
  Administered 2020-05-10: 45 ug/kg/min via INTRAVENOUS
  Administered 2020-05-10: 60.63 ug/kg/min via INTRAVENOUS
  Administered 2020-05-10: 55 ug/kg/min via INTRAVENOUS
  Administered 2020-05-10: 49.606 ug/kg/min via INTRAVENOUS
  Administered 2020-05-10: 45 ug/kg/min via INTRAVENOUS
  Administered 2020-05-10 (×2): 60.63 ug/kg/min via INTRAVENOUS
  Administered 2020-05-10: 40 ug/kg/min via INTRAVENOUS
  Administered 2020-05-11 (×2): 20 ug/kg/min via INTRAVENOUS
  Administered 2020-05-11: 40 ug/kg/min via INTRAVENOUS
  Administered 2020-05-11 (×3): 30 ug/kg/min via INTRAVENOUS
  Administered 2020-05-12: 25 ug/kg/min via INTRAVENOUS
  Filled 2020-05-09 (×3): qty 200
  Filled 2020-05-09: qty 100
  Filled 2020-05-09: qty 200
  Filled 2020-05-09 (×10): qty 100

## 2020-05-09 NOTE — ED Provider Notes (Signed)
[facility] Provider Note   CSN: [id number] Arrival date & time: 05/09/20  1641     History Chief Complaint  Patient presents with  . Allergic Reaction    Bernard Roy is a 75 y.o. male.  HPI   This patient is a 75 year old male, he has a history of hypertension, he takes several different medications but most importantly he does take lisinopril.  He noticed last night before going to bed that he was starting to have some swelling of his tongue but it was not significant, when he woke up this morning he had much worsened swelling and as the day has gone on it has progressed.  Family members gave him 50 mg of Benadryl, the paramedics picked him up and brought him to the hospital, no medications given prehospital by the paramedics.  The patient has no history of allergic reactions like this, he has no rashes, no itching, no other complaints including chest pain.  The patient states he is able to breathe very well right now.  Evidently the patient does have a history of some short-term memory loss but not formally diagnosed with dementia.  Level 5 caveat applies secondary to memory loss  Past Medical History:  Diagnosis Date  . Chronic back pain   . Detached retina   . Glaucoma   . Hip pain   . HTN (hypertension)   . Hypercholesterolemia   . Memory loss    per sister. Not officially diagnosed with dementia. Able to remember short term and long term but sometimes mentions things that may not have happened and sister does not recognize  . Prostate cancer ([place])    recurrent. Radiation with first episode.     Patient Active Problem List   Diagnosis Date Noted  . History of colonic polyps 07/21/2019  . Elevated LFTs 07/21/2019    Past Surgical History:  Procedure Laterality Date  . COLONOSCOPY  2016   5 sessile polyps in ascending colon, 3 sessile polyps in transverse colon, 5 sessile polyps in sigmoid, and sigmoid diverticulosis. Path unknown  . TOTAL  HIP ARTHROPLASTY     bilateral hips, X 2       Family History  Problem Relation Age of Onset  . Colon cancer Neg Hx   . Liver disease Neg Hx     Social History   Tobacco Use  . Smoking status: Former Research scientist (life sciences)  . Smokeless tobacco: Never Used  Substance Use Topics  . Alcohol use: Not Currently    Comment: in the past  . Drug use: Never    Home Medications Prior to Admission medications   Medication Sig Start Date End Date Taking? Authorizing Provider  acetaminophen (TYLENOL) 325 MG tablet Take 650 mg by mouth in the morning and at bedtime.     [provider]  amLODipine (NORVASC) 5 MG tablet Take 5 mg by mouth daily at 12 noon.  12/15/17   [provider]  atorvastatin (LIPITOR) 80 MG tablet Take 40 mg by mouth at bedtime.  02/15/19   [provider]  Glycerin-Hypromellose-PEG 400 (DRY EYE RELIEF DROPS) 0.2-0.2-1 % SOLN Apply 1-2 drops to eye daily as needed (for dry eye relief).    [provider]  HYDROcodone-acetaminophen (NORCO/VICODIN) 5-325 MG tablet Take 1 tablet by mouth daily as needed for moderate pain.  06/22/19   [provider]  hydrOXYzine (ATARAX/VISTARIL) 10 MG tablet Take 20 mg by mouth at bedtime.    [provider]  lisinopril (ZESTRIL) 40 MG tablet Take 40 mg by mouth daily.  02/15/19   [provider]  melatonin 3 MG TABS tablet Take 3 mg by mouth at bedtime.    [provider]  polyethylene glycol-electrolytes (TRILYTE) 420 g solution Take 4,000 mLs by mouth as directed. 07/21/19   Rourk, Cristopher Estimable, MD  tamsulosin (FLOMAX) 0.4 MG CAPS capsule Take 0.4 mg by mouth daily.    [provider]  timolol (BETIMOL) 0.5 % ophthalmic solution Place 1 drop into both eyes 2 (two) times daily.    [provider]  zolpidem (AMBIEN) 10 MG tablet Take 10 mg by mouth at bedtime.  06/21/19   [provider]    Allergies    Patient has no known allergies.  Review of Systems   Review  of Systems  Unable to perform ROS: Dementia    Physical Exam Updated Vital Signs BP 139/67   Pulse 76   Temp 98.7 F (37.1 C) (Oral)   Resp 20   Ht 1.88 m (_0 )   Wt 127 kg   SpO2 100%   BMI 35.95 kg/m   Physical Exam Vitals and nursing note reviewed.  Constitutional:      General: He is not in acute distress.    Appearance: He is well-developed and well-nourished.  HENT:     Head: Normocephalic and atraumatic.     Mouth/Throat:     Mouth: Oropharynx is clear and moist.     Pharynx: No oropharyngeal exudate.     Comments: The patient is able to talk, he is able to breathe, he has a very large tongue which is swollen and asymmetrical, he appears to have clinically significant angioedema.  He is able to maintain his airway, speak and has no swelling around his throat or his vocal cords symptomatically or clinically, no swelling of the lips or the face Eyes:     General: No scleral icterus.       Right eye: No discharge.        Left eye: No discharge.     Extraocular Movements: EOM normal.     Conjunctiva/sclera: Conjunctivae normal.     Pupils: Pupils are equal, round, and reactive to light.     Comments: Disconjugate gaze, patient states that is his baseline  Neck:     Thyroid: No thyromegaly.     Vascular: No JVD.  Cardiovascular:     Rate and Rhythm: Normal rate and regular rhythm.     Pulses: Intact distal pulses.     Heart sounds: Normal heart sounds. No murmur heard. No friction rub. No gallop.   Pulmonary:     Effort: Pulmonary effort is normal. No respiratory distress.     Breath sounds: Normal breath sounds. No wheezing or rales.  Abdominal:     General: Bowel sounds are normal. There is no distension.     Palpations: Abdomen is soft. There is no mass.     Tenderness: There is no abdominal tenderness.  Musculoskeletal:        General: No tenderness or edema. Normal range of motion.     Cervical back: Normal range of motion and neck supple.   Lymphadenopathy:     Cervical: No cervical adenopathy.  Skin:    General: Skin is warm and dry.     Findings: No erythema or rash.  Neurological:     Mental Status: He is alert.     Coordination: Coordination normal.  Psychiatric:  Mood and Affect: Mood and affect normal.        Behavior: Behavior normal.     ED Results / Procedures / Treatments   Labs (all labs ordered are listed, but only abnormal results are displayed) Labs Reviewed  SARS CORONAVIRUS 2 (TAT 6-24 HRS)  CBC  BASIC METABOLIC PANEL    EKG EKG Interpretation  Date/Time:  Wednesday May 09 2020 16:57:13 EST Ventricular Rate:  85 PR Interval:    QRS Duration: 122 QT Interval:  362 QTC Calculation: 431 R Axis:   -38 Text Interpretation: Normal sinus rhythm Poor R wave progression Left axis deviation no other findings. Confirmed by Noemi Chapel [phone]) on 05/09/2020 5:32:22 PM   Radiology No results found.  Procedures Procedure Name: Intubation Date/Time: 05/09/2020 6:56 PM Performed by: Noemi Chapel, MD Pre-anesthesia Checklist: Patient identified, Patient being monitored, Emergency Drugs available, Timeout performed and Suction available Oxygen Delivery Method: Non-rebreather mask Preoxygenation: Pre-oxygenation with 100% oxygen Induction Type: Rapid sequence Ventilation: Mask ventilation without difficulty Laryngoscope Size: Mac and 4 Grade View: Grade IV Tube size: 7.5 mm Number of attempts: 1 Airway Equipment and Method: Stylet Placement Confirmation: ETT inserted through vocal cords under direct vision,  CO2 detector and Breath sounds checked- equal and bilateral Secured at: 23 cm Dental Injury: Teeth and Oropharynx as per pre-operative assessment  Difficulty Due To: Difficulty was anticipated Comments:      .Critical Care Performed by: Noemi Chapel, MD Authorized by: Noemi Chapel, MD   Critical care provider statement:    Critical care time (minutes):  35   Critical  care time was exclusive of:  Separately billable procedures and treating other patients and teaching time   Critical care was necessary to treat or prevent imminent or life-threatening deterioration of the following conditions: obstructed airway / angioedema.   Critical care was time spent personally by me on the following activities:  Blood draw for specimens, development of treatment plan with patient or surrogate, discussions with consultants, evaluation of patient's response to treatment, examination of patient, obtaining history from patient or surrogate, ordering and performing treatments and interventions, ordering and review of laboratory studies, ordering and review of radiographic studies, pulse oximetry, re-evaluation of patient's condition and review of old charts Comments:           Medications Ordered in ED Medications  povidone-iodine (BETADINE) 10 % external solution (has no administration in time range)  fentaNYL 2551mg in NS 2566m(1065mml) infusion-PREMIX (25 mcg/hr Intravenous New Bag/Given 05/09/20 1903)  methylPREDNISolone sodium succinate (SOLU-MEDROL) 125 mg/2 mL injection 125 mg (125 mg Intravenous Given 05/09/20 1711)  famotidine (PEPCID) IVPB 20 mg premix (0 mg Intravenous Stopped 05/09/20 1745)  diphenhydrAMINE (BENADRYL) injection 50 mg (50 mg Intravenous Given 05/09/20 1709)  propofol (DIPRIVAN) 1000 MG/100ML infusion (50 mcg/kg/min  127 kg Intravenous Rate/Dose Change 05/09/20 1854)  etomidate (AMIDATE) injection (30 mg Intravenous Given 05/09/20 1830)  succinylcholine (ANECTINE) injection (200 mg Intravenous Given 05/09/20 1831)  EPINEPHrine (EPI-PEN) injection 0.3 mg (0.3 mg Intramuscular Given 05/09/20 1840)  rocuronium (ZEMURON) injection (100 mg Intravenous Given 05/09/20 1848)    ED Course  I have reviewed the triage vital signs and the nursing notes.  Pertinent labs & imaging results that were available during my care of the patient were reviewed by me and  considered in my medical decision making (see chart for details).  Clinical Course as of 05/09/20 1905  Wed May 09, 2020  1731 I have rechecked the patient twice, there has been no  progression of his tongue swelling. [BM]  1811 Reexamined, patient now having worsening swelling of the tongue - decision to make to precede with intubation - the pt is agreeable after being given the risks benefits and alternatives.  I think it would be worse to wait at this time as the patient has significant swelling of his tongue and a tracheostomy is a less desirable option. [BM]    Clinical Course User Index [BM] Noemi Chapel, MD   MDM Rules/Calculators/A&P                          This patient is ill-appearing with what appears to be moderate to severe angioedema but at this time has enough of an airway that I do not think he needs to have definitive airway management.  I will recheck him frequently, he will be given Solu-Medrol and Pepcid and another dose of Benadryl, placed on cardiac monitor and have will have labs and Covid testing in case he needs to be admitted.  He has agreed to being intubated if needed.  Please see the intubation note attached.  The patient's tongue progressively worsened, epinephrine given, no significant improvement, is started to protrude from the mouth and was symmetrically enlarged, he was having more difficulty talking, his breathing seemed okay.  Patient was successfully intubated on the first attempt by direct laryngoscopy with a 7-1/2 endotracheal tube with a cough.  Labs have been drawn, post intubation chest x-ray obtained, will discuss with the hospitalist for admission of this very ill critically ill patient.  Currently getting propofol 27m / kg / min and fentanyl gtt   Discussed with Dr. eDenton Brickwho has been kind enough to admit this patient to the ICU    Final Clinical Impression(s) / ED Diagnoses Final diagnoses:  Angioedema, initial encounter  Airway  obstruction    Rx / DC Orders ED Discharge Orders    None       MNoemi Chapel MD 05/09/20 1905

## 2020-05-09 NOTE — ED Triage Notes (Signed)
Pt called out for possible allergic reaction. Presents with swollen tongue, right more than left. Unsure of possible allergen. Pt reports using icy hot to left shoulder. 50mg  benadryl given to pt prior to leaving residence.

## 2020-05-09 NOTE — H&P (Addendum)
History and Physical    Bernard Roy GDJ:[id number] DOB: November 04, 1945 DOA: 05/09/2020  PCP: [facility]   Patient coming from: Home  I have personally briefly reviewed patient's old medical records in [place]  Chief Complaint: Allergic reaction.  HPI: Bernard Roy is a 75 y.o. male with medical history significant for hypertension, dyslipidemia, prostate cancer. History is obtained from chart review, as a return of my evaluation patient is sedated and intubated. Patient presented to the ED with complaints of a possible allergic reaction, reporting swelling of his tongue.  Patient reported last night before going to bed he noticed some swelling of his tongue, this morning swelling had progressed , and continued during the day.  Family members given 50 mg of Benadryl.  There is no history of similar allergic reactions.  Initially patient had no problem with breathing.  ED Course: Temperature 98.7.  Heart rate 130s to 150s.  Stable O2 sats on room air.  He was initially given Solu-Medrol Pepcid and Benadryl.  With continued observation in the ED, patient still continued to swell, protruding from his mouth, with symmetric enlargement, was having more difficulty talking but his breathing was okay.  Patient was subsequently intubated for airway protection. Hospitalist to admit  Review of Systems: Unable to ascertain as patient is bated and on ventilator.  Past Medical History:  Diagnosis Date  . Chronic back pain   . Detached retina   . Glaucoma   . Hip pain   . HTN (hypertension)   . Hypercholesterolemia   . Memory loss    per sister. Not officially diagnosed with dementia. Able to remember short term and long term but sometimes mentions things that may not have happened and sister does not recognize  . Prostate cancer ([facility])    recurrent. Radiation with first episode.     Past Surgical History:  Procedure Laterality Date  . COLONOSCOPY  2016   5 sessile  polyps in ascending colon, 3 sessile polyps in transverse colon, 5 sessile polyps in sigmoid, and sigmoid diverticulosis. Path unknown  . TOTAL HIP ARTHROPLASTY     bilateral hips, X 2     reports that he has quit smoking. He has never used smokeless tobacco. He reports previous alcohol use. He reports that he does not use drugs.  No Known Allergies  Family History  Problem Relation Age of Onset  . Colon cancer Neg Hx   . Liver disease Neg Hx     Prior to Admission medications   Medication Sig Start Date End Date Taking? Authorizing Provider  acetaminophen (TYLENOL) 325 MG tablet Take 650 mg by mouth in the morning and at bedtime.     [provider]  amLODipine (NORVASC) 5 MG tablet Take 5 mg by mouth daily at 12 noon.  12/15/17   [provider]  atorvastatin (LIPITOR) 80 MG tablet Take 40 mg by mouth at bedtime.  02/15/19   [provider]  Glycerin-Hypromellose-PEG 400 (DRY EYE RELIEF DROPS) 0.2-0.2-1 % SOLN Apply 1-2 drops to eye daily as needed (for dry eye relief).    [provider]  HYDROcodone-acetaminophen (NORCO/VICODIN) 5-325 MG tablet Take 1 tablet by mouth daily as needed for moderate pain.  06/22/19   [provider]  hydrOXYzine (ATARAX/VISTARIL) 10 MG tablet Take 20 mg by mouth at bedtime.    [provider]  lisinopril (ZESTRIL) 40 MG tablet Take 40 mg by mouth daily.  02/15/19   [provider]  melatonin 3 MG TABS tablet Take 3 mg by mouth at bedtime.    [provider]  polyethylene glycol-electrolytes (TRILYTE) 420 g solution Take 4,000 mLs by mouth as directed. 07/21/19   Rourk, Cristopher Estimable, MD  tamsulosin (FLOMAX) 0.4 MG CAPS capsule Take 0.4 mg by mouth daily.    [provider]  timolol (BETIMOL) 0.5 % ophthalmic solution Place 1 drop into both eyes 2 (two) times daily.    [provider]  zolpidem (AMBIEN) 10 MG tablet Take 10 mg by mouth at bedtime.  06/21/19   [provider]    Physical Exam: Vitals:   05/09/20 1730 05/09/20 1800 05/09/20 1829 05/09/20 1830  BP: 130/61 137/62  139/67  Pulse: 84 80  76  Resp: 20 19  20   Temp:      TempSrc:      SpO2: 97% 97% 100% 100%  Weight:      Height:   6\' 2"  (1.88 m)     Constitutional: Sedated intubated on ventilator. Vitals:   05/09/20 1730 05/09/20 1800 05/09/20 1829 05/09/20 1830  BP: 130/61 137/62  139/67  Pulse: 84 80  76  Resp: 20 19  20   Temp:      TempSrc:      SpO2: 97% 97% 100% 100%  Weight:      Height:   6\' 2"  (1.88 m)    Eyes: PERRL, lids and conjunctivae normal ENMT: Intubated.  Obvious tongue swelling, protruding from patient's mouth Neck: normal, supple, no masses, no thyromegaly Respiratory: Equal air entry, intubated.   Cardiovascular: Regular rate and rhythm, no murmurs / rubs / gallops. 2+ pedal pulses.  Abdomen: obese, no tenderness, no masses palpated. No hepatosplenomegaly. Bowel sounds positive.  Musculoskeletal: no clubbing / cyanosis. No joint deformity upper and lower extremities.  Skin: no rashes, lesions, ulcers. No induration Neurologic: Sedated. Psychiatric: Normal judgment and insight. Alert and oriented x 3. Normal mood.   Labs on Admission: I have personally reviewed following labs and imaging studies.  Radiological Exams on Admission: No results found.  EKG: Independently reviewed.  Sinus rhythm.  Artifacts present.  Assessment/Plan Principal Problem:   Angioedema Active Problems:   HTN (hypertension)   Angioedema-significant tongue swelling, no similar prior allergic reactions.  Likely 2/2 ACE inhibitor induced angioedema.  Patient is on lisinopril.  Requiring intubation and mechanical ventilation due to severe tongue swelling and impending respiratory failure in the ED.  Post intubation chest x-ray shows endotracheal tube in appropriate position, low lung volumes left base atelectasis or infiltrate. - Started on propofol and Continuous fentanyl drip in the  ED, will continue -Discontinue lisinopril -Portable chest x-ray in a.m. -ABG in a.m. -Allergy list updated -N.p.o. -IV Solu-Medrol 125 mg given, continue 80 twice daily -Benadryl 50 mg every 8 hourly X 2  -IV famotidine 20 mg daily - N/s 75cc/hr -Monitor Glucose while on steroids  Hypertension-stable. -Discontinue lisinopril -Hold Norvasc while n.p.o. -As needed labetalol 10mg  for systolic greater than 158.   Dysipidemia-  -Hold statin  CRITICAL CARE Performed by: Bethena Roys   Total critical care time: 50 minutes  Critical care time was exclusive of separately billable procedures and treating other patients.  Critical care was necessary to treat or prevent imminent or life-threatening deterioration.  Critical care was time spent personally by me on the following activities: development of treatment plan with patient and/or surrogate as well as nursing, discussions with consultants, evaluation of patient's response to treatment, examination of patient, obtaining  history from patient or surrogate, ordering and performing treatments and interventions, ordering and review of laboratory studies, ordering and review of radiographic studies, pulse oximetry and re-evaluation of patient's condition.   DVT prophylaxis: Lovenox Code Status: Full code Family Communication: None at bedside Disposition Plan: ~ 1-2 days Consults called: None. Admission status: Inpatient, ICU. I certify that at the point of admission it is my clinical judgment that the patient will require inpatient hospital care spanning beyond 2 midnights from the point of admission due to high intensity of service, high risk for further deterioration and high frequency of surveillance required. The following factors support the patient status of inpatient:    Bethena Roys MD [facility]  05/09/2020, 9:27 PM

## 2020-05-10 ENCOUNTER — Inpatient Hospital Stay (HOSPITAL_COMMUNITY): Payer: No Typology Code available for payment source

## 2020-05-10 DIAGNOSIS — J9601 Acute respiratory failure with hypoxia: Secondary | ICD-10-CM | POA: Diagnosis not present

## 2020-05-10 DIAGNOSIS — T783XXA Angioneurotic edema, initial encounter: Principal | ICD-10-CM

## 2020-05-10 LAB — BLOOD GAS, ARTERIAL
Acid-Base Excess: 1.7 mmol/L (ref 0.0–2.0)
Bicarbonate: 26.6 mmol/L (ref 20.0–28.0)
FIO2: 45
O2 Saturation: 98.8 %
Patient temperature: 37
pCO2 arterial: 30.2 mmHg — ABNORMAL LOW (ref 32.0–48.0)
pH, Arterial: 7.519 — ABNORMAL HIGH (ref 7.350–7.450)
pO2, Arterial: 159 mmHg — ABNORMAL HIGH (ref 83.0–108.0)

## 2020-05-10 LAB — BASIC METABOLIC PANEL
Anion gap: 9 (ref 5–15)
BUN: 18 mg/dL (ref 8–23)
CO2: 23 mmol/L (ref 22–32)
Calcium: 9 mg/dL (ref 8.9–10.3)
Chloride: 102 mmol/L (ref 98–111)
Creatinine, Ser: 0.86 mg/dL (ref 0.61–1.24)
GFR, Estimated: >60 mL/min (ref >60)
Glucose, Bld: 235 mg/dL — ABNORMAL HIGH (ref 70–99)
Potassium: 3.8 mmol/L (ref 3.5–5.1)
Sodium: 134 mmol/L — ABNORMAL LOW (ref 135–145)

## 2020-05-10 LAB — CBG MONITORING, ED: Glucose-Capillary: 209 mg/dL — ABNORMAL HIGH (ref 70–99)

## 2020-05-10 LAB — CBC
HCT: 35.2 % — ABNORMAL LOW (ref 39.0–52.0)
Hemoglobin: 11.6 g/dL — ABNORMAL LOW (ref 13.0–17.0)
MCH: 30.4 pg (ref 26.0–34.0)
MCHC: 33 g/dL (ref 30.0–36.0)
MCV: 92.1 fL (ref 80.0–100.0)
Platelets: 240 10*3/uL (ref 150–400)
RBC: 3.82 MIL/uL — ABNORMAL LOW (ref 4.22–5.81)
RDW: 13 % (ref 11.5–15.5)
WBC: 10.8 10*3/uL — ABNORMAL HIGH (ref 4.0–10.5)
nRBC: 0 % (ref 0.0–0.2)

## 2020-05-10 LAB — SARS CORONAVIRUS 2 (TAT 6-24 HRS): SARS Coronavirus 2: NEGATIVE

## 2020-05-10 LAB — GLUCOSE, CAPILLARY
Glucose-Capillary: 206 mg/dL — ABNORMAL HIGH (ref 70–99)
Glucose-Capillary: 213 mg/dL — ABNORMAL HIGH (ref 70–99)

## 2020-05-10 LAB — MRSA PCR SCREENING: MRSA by PCR: NEGATIVE

## 2020-05-10 MED ORDER — ORAL CARE MOUTH RINSE
15.0000 mL | OROMUCOSAL | Status: DC
  Administered 2020-05-10 – 2020-05-12 (×21): 15 mL via OROMUCOSAL

## 2020-05-10 MED ORDER — CHLORHEXIDINE GLUCONATE 0.12% ORAL RINSE (MEDLINE KIT)
15.0000 mL | Freq: Two times a day (BID) | OROMUCOSAL | Status: DC
  Administered 2020-05-10 – 2020-05-12 (×3): 15 mL via OROMUCOSAL

## 2020-05-10 MED ORDER — CHLORHEXIDINE GLUCONATE CLOTH 2 % EX PADS
6.0000 | MEDICATED_PAD | Freq: Every day | CUTANEOUS | Status: DC
  Administered 2020-05-10 – 2020-05-18 (×9): 6 via TOPICAL

## 2020-05-10 NOTE — Progress Notes (Signed)
PROGRESS NOTE    Bernard Roy  XLK:[id number] DOB: 1946-04-10 DOA: 05/09/2020 PCP: [facility]   Brief Narrative:  Per HPI: Bernard Roy is a 75 y.o. male with medical history significant for hypertension, dyslipidemia, prostate cancer. History is obtained from chart review, as a return of my evaluation patient is sedated and intubated. Patient presented to the ED with complaints of a possible allergic reaction, reporting swelling of his tongue.  Patient reported last night before going to bed he noticed some swelling of his tongue, this morning swelling had progressed , and continued during the day.  Family members given 50 mg of Benadryl.  There is no history of similar allergic reactions.  Initially patient had no problem with breathing.  Assessment & Plan:   Principal Problem:   Angioedema Active Problems:   HTN (hypertension)   Angioedema-likely secondary to ACE inhibitor use - Started on propofol and Continuous fentanyl drip in the ED, will continue -Discontinue lisinopril -Chest x-ray and ABG stable -Allergy list updated -N.p.o. -IV Solu-Medrol 125 mg given, continue 80 twice daily -Benadryl 50 mg every 8 hourly X 2  -IV famotidine 20 mg daily - N/s 75cc/hr -Monitor Glucose while on steroids  Hypertension-stable. -Discontinue lisinopril -Hold Norvasc while n.p.o. -As needed labetalol 10mg  for systolic greater than 366.  Dysipidemia-  -Hold statin   DVT prophylaxis:Lovenox Code Status: Full Family Communication: Discussed with sister on phone 1/27 Disposition Plan:  Status is: Inpatient  Remains inpatient appropriate because:IV treatments appropriate due to intensity of illness or inability to take PO and Inpatient level of care appropriate due to severity of illness   Dispo: The patient is from: Home              Anticipated d/c is to: Home              Anticipated d/c date is: 2 days              Patient currently is not medically  stable to d/c.   Difficult to place patient No  Nutritional Assessment:  The patient's BMI is: Body mass index is 35.95 kg/m.Marland Kitchen    Consultants:   PCCM  Procedures:   See below  Antimicrobials:   None   Subjective: Patient seen and evaluated today and is currently sedated on ventilator.  Objective: Vitals:   05/10/20 1105 05/10/20 1110 05/10/20 1115 05/10/20 1130  BP:    (!) 163/77  Pulse: (!) 43 (!) 43 (!) 43 (!) 43  Resp: 18 18 18 18   Temp:      TempSrc:      SpO2: 100% 100% 100% 100%  Weight:      Height:        Intake/Output Summary (Last 24 hours) at 05/10/2020 1156 Last data filed at 05/10/2020 4403 Gross per 24 hour  Intake 787.1 ml  Output --  Net 787.1 ml   Filed Weights   05/09/20 1647  Weight: 127 kg    Examination:  General exam: Sedated on ventilator Respiratory system: Clear to auscultation. Respiratory effort normal.  Intubated on FiO2 45%. Cardiovascular system: S1 & S2 heard, RRR.  Gastrointestinal system: Abdomen is soft Central nervous system: Sedated Extremities: No edema Skin: No significant lesions noted Psychiatry: Cannot be assessed    Data Reviewed: I have personally reviewed following labs and imaging studies  CBC: Recent Labs  Lab 05/09/20 1845 05/10/20 0538  WBC 14.3* 10.8*  HGB 12.5* 11.6*  HCT 39.5 35.2*  MCV  94.3 92.1  PLT 285 010   Basic Metabolic Panel: Recent Labs  Lab 05/09/20 1920 05/10/20 0538  NA 138 134*  K 4.2 3.8  CL 103 102  CO2 27 23  GLUCOSE 143* 235*  BUN 14 18  CREATININE 0.77 0.86  CALCIUM 9.2 9.0   GFR: Estimated Creatinine Clearance: 106.7 mL/min (by C-G formula based on SCr of 0.86 mg/dL). Liver Function Tests: No results for input(s): AST, ALT, ALKPHOS, BILITOT, PROT, ALBUMIN in the last 168 hours. No results for input(s): LIPASE, AMYLASE in the last 168 hours. No results for input(s): AMMONIA in the last 168 hours. Coagulation Profile: No results for input(s): INR, PROTIME  in the last 168 hours. Cardiac Enzymes: No results for input(s): CKTOTAL, CKMB, CKMBINDEX, TROPONINI in the last 168 hours. BNP (last 3 results) No results for input(s): PROBNP in the last 8760 hours. HbA1C: No results for input(s): HGBA1C in the last 72 hours. CBG: Recent Labs  Lab 05/10/20 0820  GLUCAP 209*   Lipid Profile: No results for input(s): CHOL, HDL, LDLCALC, TRIG, CHOLHDL, LDLDIRECT in the last 72 hours. Thyroid Function Tests: No results for input(s): TSH, T4TOTAL, FREET4, T3FREE, THYROIDAB in the last 72 hours. Anemia Panel: No results for input(s): VITAMINB12, FOLATE, FERRITIN, TIBC, IRON, RETICCTPCT in the last 72 hours. Sepsis Labs: No results for input(s): PROCALCITON, LATICACIDVEN in the last 168 hours.  No results found for this or any previous visit (from the past 240 hour(s)).       Radiology Studies: DG CHEST PORT 1 VIEW  Result Date: 05/10/2020 CLINICAL DATA:  Intubation. EXAM: PORTABLE CHEST 1 VIEW COMPARISON:  Chest x-ray 05/09/2020. FINDINGS: Endotracheal tube and NG tube in stable position. Heart size normal. Left base atelectasis/infiltrate. Tiny left pleural effusion. No pneumothorax. Degenerative changes scoliosis thoracic spine. IMPRESSION: 1. Endotracheal tube and NG tube in stable position. 2. Left base atelectasis/infiltrate. Tiny left pleural effusion. Electronically Signed   By: Marcello Moores  Register   On: 05/10/2020 05:31   DG Chest Port 1 View  Result Date: 05/09/2020 CLINICAL DATA:  Tube placement EXAM: PORTABLE CHEST 1 VIEW COMPARISON:  08/08/2019 FINDINGS: Endotracheal tube is 7 cm above the carina. Cardiomegaly. Low lung volumes. Left base atelectasis or infiltrate. No confluent opacity on the right. No effusions or pneumothorax. IMPRESSION: Endotracheal tube 7 cm above the carina. Low lung volumes, left base atelectasis or infiltrate. Electronically Signed   By: Rolm Baptise M.D.   On: 05/09/2020 19:04   DG Chest Port 1V same Day  Result  Date: 05/09/2020 CLINICAL DATA:  Allergic reaction, intubated EXAM: PORTABLE CHEST 1 VIEW COMPARISON:  05/09/2020 at 6:53 p.m. FINDINGS: Single frontal view of the chest demonstrates endotracheal tube overlying tracheal air column tip at level of thoracic inlet. Enteric catheter passes below diaphragm, tip excluded by collimation. Progressive consolidation at the left lung base may reflect hypoventilatory change. No large effusion or pneumothorax. No acute bony abnormality. IMPRESSION: 1. Support devices as above. 2. Slight progression of left basilar atelectasis. Electronically Signed   By: Randa Ngo M.D.   On: 05/09/2020 22:49        Scheduled Meds: . diphenhydrAMINE  50 mg Intravenous Q8H  . docusate  100 mg Per Tube BID  . enoxaparin (LOVENOX) injection  40 mg Subcutaneous QHS  . methylPREDNISolone (SOLU-MEDROL) injection  80 mg Intravenous Q12H  . polyethylene glycol  17 g Per Tube Daily   Continuous Infusions: . sodium chloride 75 mL/hr at 05/10/20 0638  . famotidine (PEPCID) IV  20 mg (05/10/20 0953)  . fentaNYL infusion INTRAVENOUS 175 mcg/hr (05/10/20 1042)  . propofol (DIPRIVAN) infusion 60.63 mcg/kg/min (05/10/20 1020)     LOS: 1 day    Time spent: 35 minutes    Piper Hassebrock Darleen Crocker, DO [facility]  If 7PM-7AM, please contact night-coverage [email or web address] 05/10/2020, 11:56 AM

## 2020-05-10 NOTE — Clinical Social Work Note (Signed)
[facility] notification is [phone]

## 2020-05-10 NOTE — Progress Notes (Signed)
RT transported patient to ICU on ventilator from the ER, no complications noted.Patient remained stable throughout. RN at bedside.

## 2020-05-10 NOTE — Consult Note (Addendum)
NAME:  Bernard Roy, MRN:  [id number], DOB:  1945-08-10, LOS: 1 ADMISSION DATE:  05/09/2020, CONSULTATION DATE:  1/27 REFERRING MD:  Hassell Halim  CHIEF COMPLAINT:  resp distress on ACEi   Brief History:  75 yo male former smoker on ACei with acute tongue swelling starting pm 1/25 and req et in ER  Pm 1/26 and PCCM consulted am 1/27    History of Present Illness:  76 y.o. male with medical history significant for hypertension, dyslipidemia, prostate cancer who presented to the ED with complaints of a possible allergic reaction, reporting swelling of his tongue.  Patient reported 1/25 pm before going to bed he noticed some swelling of his tongue, am 1/26  swelling had progressed , and continued during the day.  Family members given 50 mg of Benadryl.  There is no history of similar allergic reactions.  Initially patient had no problem with breathing.  ED Course: Temperature 98.7.  Heart rate 130s to 150s.  Stable O2 sats on room air.  He was initially given Solu-Medrol Pepcid and Benadryl.  With continued observation in the ED, patient still continued to swell, protruding from his mouth, with symmetric enlargement, was having more difficulty talking but his breathing was okay.  Patient was subsequently intubated for airway protection.    Past Medical History:   Past Medical History:  Diagnosis Date  . Chronic back pain   . Detached retina   . Glaucoma   . Hip pain   . HTN (hypertension)   . Hypercholesterolemia   . Memory loss    per sister. Not officially diagnosed with dementia. Able to remember short term and long term but sometimes mentions things that may not have happened and sister does not recognize  . Prostate cancer ([place])    recurrent. Radiation with first episode.      Significant Hospital Events:  Intubated in ER 1/26 pm   Consults:  PCCM am 1/27   Procedures:  ET  1/26  Significant Diagnostic Tests:     Micro Data:  Covid 19 PCR  1/26  Neg  MRA PCR  1/26  neg   Antimicrobials:     Scheduled Meds: . chlorhexidine gluconate (MEDLINE KIT)  15 mL Mouth Rinse BID  . Chlorhexidine Gluconate Cloth  6 each Topical Daily  . docusate  100 mg Per Tube BID  . enoxaparin (LOVENOX) injection  40 mg Subcutaneous QHS  . mouth rinse  15 mL Mouth Rinse 10 times per day  . methylPREDNISolone (SOLU-MEDROL) injection  80 mg Intravenous Q12H  . polyethylene glycol  17 g Per Tube Daily   Continuous Infusions: . sodium chloride 75 mL/hr at 05/10/20 1532  . famotidine (PEPCID) IV 20 mg (05/10/20 0953)  . fentaNYL infusion INTRAVENOUS 125 mcg/hr (05/10/20 1532)  . propofol (DIPRIVAN) infusion 45 mcg/kg/min (05/10/20 1532)   PRN Meds:.acetaminophen **OR** acetaminophen, labetalol, ondansetron **OR** ondansetron (ZOFRAN) IV, polyethylene glycol   Interim History / Subjective:  Sedated, comfortable appearing, not breathing over back up rate   Objective   Blood pressure (!) 183/76, pulse (!) 42, temperature 98.7 F (37.1 C), temperature source Oral, resp. rate 16, height '6\' 2"'  (1.88 m), weight 134.4 kg, SpO2 98 %.    Vent Mode: PRVC FiO2 (%):  [40 %-100 %] 40 % Set Rate:  [16 bmp] 16 bmp Vt Set:  [650 mL] 650 mL PEEP:  [5 cmH20] 5 cmH20 Plateau Pressure:  [17 cmH20-20 cmH20] 20 cmH20   Intake/Output Summary (Last 24 hours)  at 05/10/2020 1545 Last data filed at 05/10/2020 1532 Gross per 24 hour  Intake 2523.14 ml  Output -  Net 2523.14 ml   Filed Weights   05/09/20 1647 05/10/20 1318  Weight: 127 kg 134.4 kg    Examination: Tmax  98.7  General: sedated on vent  HENT: oral et/ tongue protrusion noted  Lungs: clear bilaterally - no air trapping, not breathing over back up rate  Cardiovascular: RRR no s3  Abdomen: soft/ benign Extremities: warm s cyanosis, clubbing or edema Neuro: sedated     I personally reviewed images and agree with radiology impression as follows:  CXR:   Portable 1/27  1. Endotracheal tube and NG tube in stable  position. 2. Left base atelectasis/infiltrate. Tiny left pleural effusion.      Assessment & Plan:   1) Vent dep secondary to UAO from ACEi case causing severe angioedema/ tongue swelling  >>> main need to keep airway secure at this point so needs heavy sedation   2) Angioedema, no new onset on ACEi >>> already rx h1 and H2 and steroids, may take several days to resolve.  3) LL Atx ? Asp or mucus plug ?   Best practice (evaluated daily)  Diet: npo  Pain/Anxiety/Delirium protocol (if indicated): sedated on diprovan VAP protocol (if indicated):  DVT prophylaxis: lovenox GI prophylaxis: pepcid Glucose control: per triad  Mobility: sbr  Disposition:ICU  Goals of Care:  Last date of multidisciplinary goals of care discussion:per [facility] Family and staff present:  Summary of discussion:  Follow up goals of care discussion due:  Code Status: Full code   Labs   CBC: Recent Labs  Lab 05/09/20 1845 05/10/20 0538  WBC 14.3* 10.8*  HGB 12.5* 11.6*  HCT 39.5 35.2*  MCV 94.3 92.1  PLT 285 902    Basic Metabolic Panel: Recent Labs  Lab 05/09/20 1920 05/10/20 0538  NA 138 134*  K 4.2 3.8  CL 103 102  CO2 27 23  GLUCOSE 143* 235*  BUN 14 18  CREATININE 0.77 0.86  CALCIUM 9.2 9.0   GFR: Estimated Creatinine Clearance: 109.9 mL/min (by C-G formula based on SCr of 0.86 mg/dL). Recent Labs  Lab 05/09/20 1845 05/10/20 0538  WBC 14.3* 10.8*    Liver Function Tests: No results for input(s): AST, ALT, ALKPHOS, BILITOT, PROT, ALBUMIN in the last 168 hours. No results for input(s): LIPASE, AMYLASE in the last 168 hours. No results for input(s): AMMONIA in the last 168 hours.  ABG    Component Value Date/Time   PHART 7.519 (H) 05/10/2020 0625   PCO2ART 30.2 (L) 05/10/2020 0625   PO2ART 159 (H) 05/10/2020 0625   HCO3 26.6 05/10/2020 0625   O2SAT 98.8 05/10/2020 0625     Coagulation Profile: No results for input(s): INR, PROTIME in the last 168 hours.  Cardiac  Enzymes: No results for input(s): CKTOTAL, CKMB, CKMBINDEX, TROPONINI in the last 168 hours.  HbA1C: No results found for: HGBA1C  CBG: Recent Labs  Lab 05/10/20 0820  GLUCAP 209*       Past Medical History:  He,  has a past medical history of Chronic back pain, Detached retina, Glaucoma, Hip pain, HTN (hypertension), Hypercholesterolemia, Memory loss, and Prostate cancer ([place]).   Surgical History:   Past Surgical History:  Procedure Laterality Date  . COLONOSCOPY  2016   5 sessile polyps in ascending colon, 3 sessile polyps in transverse colon, 5 sessile polyps in sigmoid, and sigmoid diverticulosis. Path unknown  . TOTAL HIP  ARTHROPLASTY     bilateral hips, X 2     Social History:   reports that he has quit smoking. He has never used smokeless tobacco. He reports previous alcohol use. He reports that he does not use drugs.   Family History:  His family history is negative for Colon cancer and Liver disease.   Allergies Allergies  Allergen Reactions  . Lisinopril Swelling    Angioedema/tongue swelling, requiring mechanical intubation. 05/09/20.     Home Medications  Prior to Admission medications   Medication Sig Start Date End Date Taking? Authorizing Provider  acetaminophen (TYLENOL) 325 MG tablet Take 650 mg by mouth in the morning and at bedtime.     [provider]  amLODipine (NORVASC) 5 MG tablet Take 5 mg by mouth daily at 12 noon.  12/15/17   [provider]  atorvastatin (LIPITOR) 80 MG tablet Take 40 mg by mouth at bedtime.  02/15/19   [provider]  Glycerin-Hypromellose-PEG 400 (DRY EYE RELIEF DROPS) 0.2-0.2-1 % SOLN Apply 1-2 drops to eye daily as needed (for dry eye relief).    [provider]  HYDROcodone-acetaminophen (NORCO/VICODIN) 5-325 MG tablet Take 1 tablet by mouth daily as needed for moderate pain.  06/22/19   [provider]  hydrOXYzine (ATARAX/VISTARIL) 10 MG tablet Take 20 mg by mouth at bedtime.     [provider]  lisinopril (ZESTRIL) 40 MG tablet Take 40 mg by mouth daily.  02/15/19   [provider]  melatonin 3 MG TABS tablet Take 3 mg by mouth at bedtime.    [provider]  polyethylene glycol-electrolytes (TRILYTE) 420 g solution Take 4,000 mLs by mouth as directed. 07/21/19   Rourk, Cristopher Estimable, MD  tamsulosin (FLOMAX) 0.4 MG CAPS capsule Take 0.4 mg by mouth daily.    [provider]  timolol (BETIMOL) 0.5 % ophthalmic solution Place 1 drop into both eyes 2 (two) times daily.    [provider]  zolpidem (AMBIEN) 10 MG tablet Take 10 mg by mouth at bedtime.  06/21/19   [provider]      The patient is critically ill with multiple organ systems failure and requires high complexity decision making for assessment and support, frequent evaluation and titration of therapies, application of advanced monitoring technologies and extensive interpretation of multiple databases. Critical Care Time devoted to patient care services described in this note is 45 minutes.    Christinia Gully, MD [facility]monary and [place] [phone]   After 7:00 pm call Elink  [phone]

## 2020-05-11 ENCOUNTER — Inpatient Hospital Stay: Payer: Self-pay

## 2020-05-11 DIAGNOSIS — J9601 Acute respiratory failure with hypoxia: Secondary | ICD-10-CM | POA: Diagnosis not present

## 2020-05-11 DIAGNOSIS — T783XXS Angioneurotic edema, sequela: Secondary | ICD-10-CM | POA: Diagnosis not present

## 2020-05-11 DIAGNOSIS — T783XXA Angioneurotic edema, initial encounter: Secondary | ICD-10-CM | POA: Diagnosis not present

## 2020-05-11 DIAGNOSIS — L8990 Pressure ulcer of unspecified site, unspecified stage: Secondary | ICD-10-CM | POA: Insufficient documentation

## 2020-05-11 DIAGNOSIS — J988 Other specified respiratory disorders: Secondary | ICD-10-CM | POA: Diagnosis not present

## 2020-05-11 LAB — CBC
HCT: 37.4 % — ABNORMAL LOW (ref 39.0–52.0)
Hemoglobin: 12.4 g/dL — ABNORMAL LOW (ref 13.0–17.0)
MCH: 30.3 pg (ref 26.0–34.0)
MCHC: 33.2 g/dL (ref 30.0–36.0)
MCV: 91.4 fL (ref 80.0–100.0)
Platelets: 243 10*3/uL (ref 150–400)
RBC: 4.09 MIL/uL — ABNORMAL LOW (ref 4.22–5.81)
RDW: 12.9 % (ref 11.5–15.5)
WBC: 13.1 10*3/uL — ABNORMAL HIGH (ref 4.0–10.5)
nRBC: 0 % (ref 0.0–0.2)

## 2020-05-11 LAB — MAGNESIUM: Magnesium: 1.8 mg/dL (ref 1.7–2.4)

## 2020-05-11 LAB — BASIC METABOLIC PANEL
Anion gap: 9 (ref 5–15)
BUN: 20 mg/dL (ref 8–23)
CO2: 22 mmol/L (ref 22–32)
Calcium: 8.7 mg/dL — ABNORMAL LOW (ref 8.9–10.3)
Chloride: 105 mmol/L (ref 98–111)
Creatinine, Ser: 0.83 mg/dL (ref 0.61–1.24)
GFR, Estimated: >60 mL/min (ref >60)
Glucose, Bld: 226 mg/dL — ABNORMAL HIGH (ref 70–99)
Potassium: 3.8 mmol/L (ref 3.5–5.1)
Sodium: 136 mmol/L (ref 135–145)

## 2020-05-11 LAB — GLUCOSE, CAPILLARY
Glucose-Capillary: 134 mg/dL — ABNORMAL HIGH (ref 70–99)
Glucose-Capillary: 163 mg/dL — ABNORMAL HIGH (ref 70–99)
Glucose-Capillary: 168 mg/dL — ABNORMAL HIGH (ref 70–99)
Glucose-Capillary: 175 mg/dL — ABNORMAL HIGH (ref 70–99)
Glucose-Capillary: 198 mg/dL — ABNORMAL HIGH (ref 70–99)
Glucose-Capillary: 203 mg/dL — ABNORMAL HIGH (ref 70–99)

## 2020-05-11 LAB — TRIGLYCERIDES: Triglycerides: 143 mg/dL (ref <150)

## 2020-05-11 LAB — PROCALCITONIN: Procalcitonin: <0.1 ng/mL

## 2020-05-11 MED ORDER — SODIUM CHLORIDE 0.9% FLUSH: 10.0000 mL | INTRAVENOUS | Status: DC | PRN

## 2020-05-11 MED ORDER — CETIRIZINE HCL 5 MG/5ML PO SOLN
10.0000 mg | Freq: Every day | ORAL | Status: DC
  Administered 2020-05-11: 10 mg
  Filled 2020-05-11 (×2): qty 10

## 2020-05-11 MED ORDER — PANTOPRAZOLE SODIUM 40 MG IV SOLR
40.0000 mg | INTRAVENOUS | Status: DC
  Administered 2020-05-11: 40 mg via INTRAVENOUS
  Filled 2020-05-11: qty 40

## 2020-05-11 MED ORDER — SODIUM CHLORIDE 0.9% FLUSH
10.0000 mL | Freq: Two times a day (BID) | INTRAVENOUS | Status: DC
  Administered 2020-05-11 – 2020-05-13 (×4): 10 mL
  Administered 2020-05-13: 20 mL
  Administered 2020-05-14 – 2020-05-18 (×9): 10 mL

## 2020-05-11 MED ORDER — FENTANYL CITRATE (PF) 100 MCG/2ML IJ SOLN: 50.0000 ug | INTRAMUSCULAR | Status: DC | PRN

## 2020-05-11 MED ORDER — ENOXAPARIN SODIUM 80 MG/0.8ML ~~LOC~~ SOLN
65.0000 mg | Freq: Every day | SUBCUTANEOUS | Status: DC
  Administered 2020-05-11 – 2020-05-17 (×7): 65 mg via SUBCUTANEOUS
  Filled 2020-05-11: qty 0.8
  Filled 2020-05-11: qty 0.65
  Filled 2020-05-11 (×2): qty 0.8
  Filled 2020-05-11 (×3): qty 0.65

## 2020-05-11 NOTE — Progress Notes (Signed)
[facility] here to transport patient. Vitals stable, WNL. Receiving RN updated.

## 2020-05-11 NOTE — Progress Notes (Signed)
Peripherally Inserted Central Catheter Placement  The IV Nurse has discussed with the patient and/or persons authorized to consent for the patient, the purpose of this procedure and the potential benefits and risks involved with this procedure.  The benefits include less needle sticks, lab draws from the catheter, and the patient may be discharged home with the catheter. Risks include, but not limited to, infection, bleeding, blood clot (thrombus formation), and puncture of an artery; nerve damage and irregular heartbeat and possibility to perform a PICC exchange if needed/ordered by physician.  Alternatives to this procedure were also discussed.  Bard Power PICC patient education guide, fact sheet on infection prevention and patient information card has been provided to patient /or left at bedside.  Phone consent obtain from sister Makoto Sellitto  [facility] Placement Documentation  PICC Triple Lumen 05/11/20 PICC Right Brachial 49 cm 0 cm (Active)  Indication for Insertion or Continuance of Line Vasoactive infusions 05/11/20 2205  Exposed Catheter (cm) 0 cm 05/11/20 2205  Site Assessment Clean;Dry;Intact 05/11/20 2205  Lumen #1 Status Flushed;Saline locked;Blood return noted 05/11/20 2205  Lumen #2 Status Flushed;Saline locked;Blood return noted 05/11/20 2205  Lumen #3 Status Flushed;Saline locked;Blood return noted 05/11/20 2205  Dressing Type Transparent 05/11/20 2205  Dressing Status Clean;Dry;Intact 05/11/20 2205  Antimicrobial disc in place? Yes 05/11/20 2205  Dressing Intervention New dressing 05/11/20 2205  Dressing Change Due 05/18/20 05/11/20 2205       Gordan Payment 05/11/2020, 10:07 PM

## 2020-05-11 NOTE — Progress Notes (Signed)
Initial Nutrition Assessment  DOCUMENTATION CODES:   Morbid obesity  INTERVENTION:  If unable to extubate within the next 24 hours recommend initiating   -Vital High Protein @ 40 ml hr, advance 10 ml every 4 hours to goal rate 60 ml/hr  -ProSource TF 45 ml 4 times daily via NG tube   This regimen will proved 1600 kcal, (2002 total kcal with propofol),170 grams protein, and 1210 ml free water   NUTRITION DIAGNOSIS:   Inadequate oral intake related to inability to eat as evidenced by NPO status.    GOAL:   Provide needs based on ASPEN/SCCM guidelines    MONITOR:   Labs,I & O's,Vent status,Diet advancement,TF tolerance,Skin,PO intake,Weight trends  REASON FOR ASSESSMENT:   Ventilator    ASSESSMENT:   75 year old male admitted with angioedema presented to ED with complaints of possible allergic reaction, reported swelling of tongue. Past medical history significant for HTN, HLD, and recurrent prostate cancer.  RD working remotely.  Pt continued to have swelling, tongue protruding from his mouth with symmetric enlargement and subsequently intubated on 1/26 for airway protection.   Unable to obtain nutrition history at this time. Per chart, pt weighed 127 kg (279.4 lbs) on 08/08/19. Currently he weighs 134.4 kg (295.68 lbs). Weights up 16 lbs (5.5%) in the last 9 months. No recent weights to trend.   Generalized; facial mild pitting edema  Patient is currently sedated intubated on ventilator support MV: 9.6 L/min Temp (24hrs), Avg:96.4 F (35.8 C), Min:96.4 F (35.8 C), Max:96.4 F (35.8 C)  Propofol: 15.24 ml/hr provides 402 kcal/day  Medications reviewed and include: Colace, Methylprednisolone, Miralax Drips: Pepcid Fentanyl  IVF: NaCl @ 75 ml/hr  Labs: CBGs [phone]  NUTRITION - FOCUSED PHYSICAL EXAM:  Unable to complete at this time  Diet Order:   Diet Order            Diet NPO time specified  Diet effective now                  EDUCATION NEEDS:   Not appropriate for education at this time  Skin:  Skin Assessment: Skin Integrity Issues: Skin Integrity Issues:: Stage II Stage II: Left buttocks  Last BM:  pta  Height:   Ht Readings from Last 1 Encounters:  05/10/20 6\' 2"  (1.88 m)    Weight:   Wt Readings from Last 1 Encounters:  05/10/20 134.4 kg    BMI:  Body mass index is 38.04 kg/m.  Estimated Nutritional Needs:   Kcal:  8119-1478  Protein:  156-173  Fluid:  >1.4 L   Lajuan Lines, RD, LDN Clinical Nutrition After Hours/Weekend Pager # in [place]

## 2020-05-11 NOTE — Progress Notes (Addendum)
NAME:  Bernard Roy, MRN:  [id number], DOB:  July 21, 1945, LOS: 2 ADMISSION DATE:  05/09/2020, CONSULTATION DATE:  1/27 REFERRING MD:  Hassell Halim  CHIEF COMPLAINT:  resp distress on ACEi   Brief History:  75 yo male former smoker on ACEi with acute tongue swelling starting pm 1/25 and req et in ER  Pm 1/26 and PCCM consulted am 1/27    History of Present Illness:  75 y.o. male with medical history significant for hypertension, dyslipidemia, prostate cancer who presented to the ED with complaints of a possible allergic reaction, reporting swelling of his tongue.  Patient reported 1/25 pm before going to bed he noticed some swelling of his tongue, am 1/26  swelling had progressed , and continued during the day.  Family members given 50 mg of Benadryl.  There is no history of similar allergic reactions.  Initially patient had no problem with breathing.  ED Course: Temperature 98.7.  Heart rate 130s to 150s.  Stable O2 sats on room air.  He was initially given Solu-Medrol Pepcid and Benadryl.  With continued observation in the ED, patient still continued to swell, protruding from his mouth, with symmetric enlargement, was having more difficulty talking but his breathing was okay.  Patient was subsequently intubated for airway protection.    Past Medical History:   Past Medical History:  Diagnosis Date  . Chronic back pain   . Detached retina   . Glaucoma   . Hip pain   . HTN (hypertension)   . Hypercholesterolemia   . Memory loss    per sister. Not officially diagnosed with dementia. Able to remember short term and long term but sometimes mentions things that may not have happened and sister does not recognize  . Prostate cancer ([place])    recurrent. Radiation with first episode.      Significant Hospital Events:  Intubated in ER 1/26 pm   Consults:  PCCM am 1/27   Procedures:  ET  1/26  Significant Diagnostic Tests:     Micro Data:  Covid 19 PCR  1/26  Neg  MRA PCR  1/26  neg  ET  05/11/2020 >>>  Antimicrobials:     Scheduled Meds: . chlorhexidine gluconate (MEDLINE KIT)  15 mL Mouth Rinse BID  . Chlorhexidine Gluconate Cloth  6 each Topical Daily  . docusate  100 mg Per Tube BID  . enoxaparin (LOVENOX) injection  65 mg Subcutaneous QHS  . mouth rinse  15 mL Mouth Rinse 10 times per day  . methylPREDNISolone (SOLU-MEDROL) injection  80 mg Intravenous Q12H  . pantoprazole (PROTONIX) IV  40 mg Intravenous Q24H  . polyethylene glycol  17 g Per Tube Daily   Continuous Infusions: . sodium chloride 75 mL/hr at 05/11/20 0339  . fentaNYL infusion INTRAVENOUS 125 mcg/hr (05/11/20 0359)  . propofol (DIPRIVAN) infusion 20 mcg/kg/min (05/11/20 0900)   PRN Meds:.acetaminophen **OR** acetaminophen, labetalol, ondansetron **OR** ondansetron (ZOFRAN) IV, polyethylene glycol   Interim History / Subjective:  Sedated on vent breathing on his own on PSV trial   Objective   Blood pressure (!) 172/89, pulse (!) 48, temperature (!) 94.3 F (34.6 C), temperature source Rectal, resp. rate 16, height '6\' 2"'  (1.88 m), weight 134.4 kg, SpO2 100 %.    Vent Mode: PRVC FiO2 (%):  [40 %] 40 % Set Rate:  [16 bmp] 16 bmp Vt Set:  [650 mL] 650 mL PEEP:  [5 cmH20] 5 cmH20 Pressure Support:  [14 cmH20] 14 cmH20 Plateau Pressure:  [  18 cmH20-20 cmH20] 19 cmH20   Intake/Output Summary (Last 24 hours) at 05/11/2020 1301 Last data filed at 05/11/2020 0912 Gross per 24 hour  Intake 3241.18 ml  Output 750 ml  Net 2491.18 ml   Filed Weights   05/09/20 1647 05/10/20 1318  Weight: 127 kg 134.4 kg    Examination: Tmax  98.7  Pt alert, approp nad @ 30 degrees  No jvd Oropharynx og/ et / still protruding tongue with no airway Neck supple Lungs with min rhonchi bilaterally RRR no s3 or or sign murmur Abd obese with limited  excursion  Ext  warm with no edema or clubbing noted     I personally reviewed images and agree with radiology impression as follows:  CXR:   Portable  1/27  1. Endotracheal tube and NG tube in stable position. 2. Left base atelectasis/infiltrate. Tiny left pleural effusion       Assessment & Plan:   1) Vent dep secondary to UAO from ACEi case causing severe angioedema/ tongue swelling  >>> main need to keep airway secure at this point so needs heavy sedation   2) Angioedema, no new onset on ACEi >>> already rx h1 and H2 and steroids, may take several days to resolve.  3) LL Atx ? Asp or mucus plug ?  ->> send sputum   Best practice (evaluated daily)  Diet: npo  Pain/Anxiety/Delirium protocol (if indicated): sedated on diprovan/fentanyl VAP protocol (if indicated):  DVT prophylaxis: lovenox GI prophylaxis: pepcid Glucose control: per triad  Mobility:  SBR Disposition:  Transfer to [place] as may need ENT eval if swelling not  Resolving   Goals of Care:  Last date of multidisciplinary goals of care discussion:per triad Family and staff present:  Summary of discussion:  Follow up goals of care discussion due:  Code Status: Full code   Labs   CBC: Recent Labs  Lab 05/09/20 1845 05/10/20 0538 05/11/20 0448  WBC 14.3* 10.8* 13.1*  HGB 12.5* 11.6* 12.4*  HCT 39.5 35.2* 37.4*  MCV 94.3 92.1 91.4  PLT 285 240 811    Basic Metabolic Panel: Recent Labs  Lab 05/09/20 1920 05/10/20 0538 05/11/20 0448  NA 138 134* 136  K 4.2 3.8 3.8  CL 103 102 105  CO2 '27 23 22  ' GLUCOSE 143* 235* 226*  BUN '14 18 20  ' CREATININE 0.77 0.86 0.83  CALCIUM 9.2 9.0 8.7*  MG  --   --  1.8   GFR: Estimated Creatinine Clearance: 113.9 mL/min (by C-G formula based on SCr of 0.83 mg/dL). Recent Labs  Lab 05/09/20 1845 05/10/20 0538 05/11/20 0448  PROCALCITON  --   --  <0.10  WBC 14.3* 10.8* 13.1*    Liver Function Tests: No results for input(s): AST, ALT, ALKPHOS, BILITOT, PROT, ALBUMIN in the last 168 hours. No results for input(s): LIPASE, AMYLASE in the last 168 hours. No results for input(s): AMMONIA in the last 168  hours.  ABG    Component Value Date/Time   PHART 7.519 (H) 05/10/2020 0625   PCO2ART 30.2 (L) 05/10/2020 0625   PO2ART 159 (H) 05/10/2020 0625   HCO3 26.6 05/10/2020 0625   O2SAT 98.8 05/10/2020 0625     Coagulation Profile: No results for input(s): INR, PROTIME in the last 168 hours.  Cardiac Enzymes: No results for input(s): CKTOTAL, CKMB, CKMBINDEX, TROPONINI in the last 168 hours.  HbA1C: No results found for: HGBA1C  CBG: Recent Labs  Lab 05/10/20 1941 05/11/20 0034 05/11/20 0406 05/11/20  0638 05/11/20 1118  GLUCAP 213* 198* 175* 203* 163*    The patient is critically ill with multiple organ systems failure and requires high complexity decision making for assessment and support, frequent evaluation and titration of therapies, application of advanced monitoring technologies and extensive interpretation of multiple databases. Critical Care Time devoted to patient care services described in this note is 35 minutes.   Christinia Gully, MD Pulmonary and [place] [phone]   After 7:00 pm call Elink  [phone]

## 2020-05-11 NOTE — Progress Notes (Signed)
NAME:  Bernard Roy, MRN:  [id number], DOB:  10-11-1945, LOS: 2 ADMISSION DATE:  05/09/2020, CONSULTATION DATE:  1/27 REFERRING MD:  Bernard Roy  CHIEF COMPLAINT:  resp distress on ACEi   Brief History:  75 yo male former smoker on ACEi with acute tongue swelling starting pm 1/25 and req et in ER  Pm 1/26 and PCCM consulted am 1/27    History of Present Illness:  75 y.o. male with medical history significant for hypertension, dyslipidemia, prostate cancer who presented to the ED with complaints of a possible allergic reaction, reporting swelling of his tongue.  Patient reported 1/25 pm before going to bed he noticed some swelling of his tongue, am 1/26  swelling had progressed , and continued during the day.  Family members given 50 mg of Benadryl.  There is no history of similar allergic reactions.  Initially patient had no problem with breathing.  ED Course: Temperature 98.7.  Heart rate 130s to 150s.  Stable O2 sats on room air.  He was initially given Solu-Medrol Pepcid and Benadryl.  With continued observation in the ED, patient still continued to swell, protruding from his mouth, with symmetric enlargement, was having more difficulty talking but his breathing was okay.  Patient was subsequently intubated for airway protection.  Per emergency department note patient was a grade 4 view with one attempt with a MAC 4 blade DL.  No comments on swelling in the glottis or laryngeal structures.  Patient has since been to [facility].  Appropriate medications including steroids and antihistamines have been given.  Patient remains intubated.  On arrival to current ICU patient with good waveforms on ventilator.  Appropriate oxygenation of 100% on FiO2 of 40%.     Past Medical History:   Past Medical History:  Diagnosis Date  . Chronic back pain   . Detached retina   . Glaucoma   . Hip pain   . HTN (hypertension)   . Hypercholesterolemia   . Memory loss    per sister. Not officially diagnosed  with dementia. Able to remember short term and long term but sometimes mentions things that may not have happened and sister does not recognize  . Prostate cancer ([place])    recurrent. Radiation with first episode.      Significant Hospital Events:  Intubated in ER 1/26 pm   Consults:  PCCM am 1/27   Procedures:  ET  1/26  Significant Diagnostic Tests:     Micro Data:  Covid 19 PCR  1/26  Neg  MRA PCR  1/26 neg  ET  05/11/2020 >>>  Antimicrobials:     Scheduled Meds: . chlorhexidine gluconate (MEDLINE KIT)  15 mL Mouth Rinse BID  . Chlorhexidine Gluconate Cloth  6 each Topical Daily  . docusate  100 mg Per Tube BID  . enoxaparin (LOVENOX) injection  65 mg Subcutaneous QHS  . mouth rinse  15 mL Mouth Rinse 10 times per day  . methylPREDNISolone (SOLU-MEDROL) injection  80 mg Intravenous Q12H  . pantoprazole (PROTONIX) IV  40 mg Intravenous Q24H  . polyethylene glycol  17 g Per Tube Daily   Continuous Infusions: . sodium chloride 75 mL/hr at 05/11/20 1900  . fentaNYL infusion INTRAVENOUS 175 mcg/hr (05/11/20 1900)  . propofol (DIPRIVAN) infusion 30 mcg/kg/min (05/11/20 1900)   PRN Meds:.acetaminophen **OR** acetaminophen, fentaNYL (SUBLIMAZE) injection, labetalol, ondansetron **OR** ondansetron (ZOFRAN) IV, polyethylene glycol   Interim History / Subjective:  Sedated on vent breathing on his own on PSV  trial   Objective   Blood pressure 101/62, pulse 69, temperature 97.9 F (36.6 C), temperature source Axillary, resp. rate 16, height _0  (1.88 m), weight 134.4 kg, SpO2 100 %.    Vent Mode: PRVC FiO2 (%):  [40 %] 40 % Set Rate:  [16 bmp] 16 bmp Vt Set:  [560 mL-650 mL] 650 mL PEEP:  [5 cmH20] 5 cmH20 Pressure Support:  [14 cmH20] 14 cmH20 Plateau Pressure:  [18 cmH20-20 cmH20] 20 cmH20   Intake/Output Summary (Last 24 hours) at 05/11/2020 2010 Last data filed at 05/11/2020 1900 Gross per 24 hour  Intake 2571.96 ml  Output 1425 ml  Net 1146.96 ml   Filed  Weights   05/09/20 1647 05/10/20 1318  Weight: 127 kg 134.4 kg    Examination: Vital signs stable on arrival Pt sedated and intubated.  On significant amount of sedation and thus unable to assess mental status.  But was following commands previously per nursing No jvd Oropharynx -protruding tongue.  Enlarged lips appreciated as well Neck supple Lungs with min rhonchi bilaterally RRR no s3 or or sign murmur Abd obese with limited  excursion  Ext  warm with no edema or clubbing noted.  No skin lesions or rashes appreciated     I personally reviewed images and agree with radiology impression as follows:  CXR:   Portable 1/27  1. Endotracheal tube and NG tube in stable position. 2. Left base atelectasis/infiltrate. Tiny left pleural effusion       Assessment & Plan:   1) Vent dep secondary to UAO from ACEi case causing severe angioedema/ tongue swelling  -Patient to continue with generous and liberal sedation with fentanyl and propofol -Has been transferred here for ENT evaluation.  As patient doing well overnight with no acute needs will defer this for now.  As for in my experience in the past ENT unable to assess laryngeal structures with a ET tube in place.   2) Angioedema,  new onset onset likely secondary to ACEi -Continue methylprednisone -Can schedule antihistamines as presumably due to ACE inhibitor but cannot rule out other culprits although did not appear to have rash at outside hospital making allergic/anaphylaxis less likely -Continue to hold ACE inhibitor   Best practice (evaluated daily)  Diet: npo  Pain/Anxiety/Delirium protocol (if indicated): sedated on diprovan/fentanyl VAP protocol (if indicated):  DVT prophylaxis: lovenox GI prophylaxis: pepcid Glucose control: per triad  Mobility:  SBR Disposition:  Transfer to [place] as may need ENT eval if swelling not  Resolving   Goals of Care:  Last date of multidisciplinary goals of care discussion: Attempted  to speak with Bernard Roy concerning patients CODE STATUS and history.  Unable to reach at this time Family and staff present:  Summary of discussion:  Follow up goals of care discussion due:  Code Status: Full code   Labs   CBC: Recent Labs  Lab 05/09/20 1845 05/10/20 0538 05/11/20 0448  WBC 14.3* 10.8* 13.1*  HGB 12.5* 11.6* 12.4*  HCT 39.5 35.2* 37.4*  MCV 94.3 92.1 91.4  PLT 285 240 622    Basic Metabolic Panel: Recent Labs  Lab 05/09/20 1920 05/10/20 0538 05/11/20 0448  NA 138 134* 136  K 4.2 3.8 3.8  CL 103 102 105  CO2 _1 GLUCOSE 143* 235* 226*  BUN _2 CREATININE 0.77 0.86 0.83  CALCIUM 9.2 9.0 8.7*  MG  --   --  1.8   GFR: Estimated Creatinine Clearance:  113.9 mL/min (by C-G formula based on SCr of 0.83 mg/dL). Recent Labs  Lab 05/09/20 1845 05/10/20 0538 05/11/20 0448  PROCALCITON  --   --  <0.10  WBC 14.3* 10.8* 13.1*    Liver Function Tests: No results for input(s): AST, ALT, ALKPHOS, BILITOT, PROT, ALBUMIN in the last 168 hours. No results for input(s): LIPASE, AMYLASE in the last 168 hours. No results for input(s): AMMONIA in the last 168 hours.  ABG    Component Value Date/Time   PHART 7.519 (H) 05/10/2020 0625   PCO2ART 30.2 (L) 05/10/2020 0625   PO2ART 159 (H) 05/10/2020 0625   HCO3 26.6 05/10/2020 0625   O2SAT 98.8 05/10/2020 0625     Coagulation Profile: No results for input(s): INR, PROTIME in the last 168 hours.  Cardiac Enzymes: No results for input(s): CKTOTAL, CKMB, CKMBINDEX, TROPONINI in the last 168 hours.  HbA1C: No results found for: HGBA1C  CBG: Recent Labs  Lab 05/11/20 0034 05/11/20 0406 05/11/20 0731 05/11/20 1118 05/11/20 1621  GLUCAP 198* 175* 203* 163* 168*    30 minutes of critical care time were taken in this patients care this evening.

## 2020-05-11 NOTE — Progress Notes (Signed)
Report called to Roselyn Reef, RN on [facility]M- ICU. [facility] called for report, ETA is about 20-25 mins.

## 2020-05-11 NOTE — Progress Notes (Signed)
Pt had a rectal temp of 92.5, order for warming blanket. Applied to patient. Pt has tolerated well and temp is slowly rising. Will continue to monitor.

## 2020-05-11 NOTE — Progress Notes (Signed)
PROGRESS NOTE    Bernard Roy  TMA:[id number] DOB: 1945/08/03 DOA: 05/09/2020 PCP: [facility]   Brief Narrative:  Per HPI: Sheikh Leverich Martinis a 75 y.o.malewith medical history significant forhypertension, dyslipidemia, prostate cancer. History is obtained from chart review, as a return of my evaluation patient is sedated and intubated. Patient presented to the ED with complaints of a possible allergic reaction, reporting swelling of his tongue.Patient reported last night before going to bed he noticed some swelling of his tongue, this morning swelling hadprogressed ,and continued during the day. Family members given 50 mg of Benadryl. There is no history of similar allergic reactions. Initially patient had no problem with breathing.  -Patient was intubated for airway protection given severe angioedema related to ACE inhibitor use.  He continues to require intubation for protection of airway given tongue swelling.  Assessment & Plan:   Principal Problem:   Angioedema Active Problems:   HTN (hypertension)   Acute respiratory failure with hypoxemia (HCC)   Angioedema-likely secondary to ACE inhibitor use - Started on propofol andContinuous fentanyl drip in the ED, will continue -Discontinued lisinopril -Chest x-ray and ABG stable -Allergy list updated -N.p.o. -IV Solu-Medrol 125 mg given, continue80 twice daily -Benadryl47m every 8 hourly X 2 doses given -IV famotidine 20 mg daily - N/s 75cc/hr -MonitorGlucose while on steroids -Consider initiation of TPN versus tube feedings if patient continues to require intubation  Hypertension-stable. -Discontinue lisinopril -Hold Norvasc while n.p.o. -As needed [id number] systolic greater than 1681  Dysipidemia- -Hold statin   DVT prophylaxis:Lovenox Code Status: Full Family Communication: Discussed with sister on phone 1/27, did not pick up 1/28 [facility] Plan[facility] Status is: Inpatient  Remains inpatient appropriate because:IV treatments appropriate due to intensity of illness or inability to take PO and Inpatient level of care appropriate due to severity of illness   Dispo: The patient is from: Home  Anticipated d/c is to: TBD  Anticipated d/c date is: TBD  Patient currently is not medically stable to d/c.  [facility] over [facility].              Difficult to place patient No  Nutritional Assessment:  The patient's BMI is: Body mass index is 35.95 kg/m..Marland Kitchen Consultants:   PCCM-now will be primary  Procedures:   See below  Antimicrobials:   None   Nutritional Assessment:  The patient's BMI is: Body mass index is 38.04 kg/m..Marland Kitchen Seen by dietician.  I agree with the assessment and plan as outlined below:  Nutrition Status: Nutrition Problem: Inadequate oral intake Etiology: inability to eat Signs/Symptoms: NPO status Interventions: Refer to RD note for recommendations   Skin Assessment:  I have examined the patient's skin and I agree with the wound assessment as performed by the wound care RN as outlined below:  Pressure Injury 05/10/20 Buttocks Left Stage 2 -  Partial thickness loss of dermis presenting as a shallow open injury with a red, pink wound bed without slough. (Active)  05/10/20 1357  Location: Buttocks  Location Orientation: Left  Staging: Stage 2 -  Partial thickness loss of dermis presenting as a shallow open injury with a red, pink wound bed without slough.  Wound Description (Comments):   Present on Admission: Yes   Subjective: Patient seen and evaluated today and is currently sedated on the ventilator with no acute events noted overnight.  He was  noted to be hypothermic overnight.  Objective: Vitals:   05/11/20 0915 05/11/20 0930 05/11/20 0945 05/11/20 1000  BP: 133/61 (!)  121/59 (!) 102/56 96/77  Pulse: (!) 49 (!) 53 (!) 50 (!) 48  Resp: '11 11 16 15  ' Temp:      TempSrc:      SpO2: 100% 100% 100% 100%  Weight:      Height:        Intake/Output Summary (Last 24 hours) at 05/11/2020 1020 Last data filed at 05/11/2020 0912 Gross per 24 hour  Intake 3241.18 ml  Output 750 ml  Net 2491.18 ml   Filed Weights   05/09/20 1647 05/10/20 1318  Weight: 127 kg 134.4 kg    Examination:  General exam: Currently sedated on ventilator Respiratory system: Clear to auscultation. Respiratory effort normal.  Intubated with FiO2 40% Cardiovascular system: S1 & S2 heard, RRR.  Gastrointestinal system: Abdomen is soft Central nervous system: Sedated on ventilator Extremities: No edema Skin: No significant lesions noted Psychiatry: Cannot be assessed    Data Reviewed: I have personally reviewed following labs and imaging studies  CBC: Recent Labs  Lab 05/09/20 1845 05/10/20 0538 05/11/20 0448  WBC 14.3* 10.8* 13.1*  HGB 12.5* 11.6* 12.4*  HCT 39.5 35.2* 37.4*  MCV 94.3 92.1 91.4  PLT 285 240 062   Basic Metabolic Panel: Recent Labs  Lab 05/09/20 1920 05/10/20 0538 05/11/20 0448  NA 138 134* 136  K 4.2 3.8 3.8  CL 103 102 105  CO2 '27 23 22  ' GLUCOSE 143* 235* 226*  BUN '14 18 20  ' CREATININE 0.77 0.86 0.83  CALCIUM 9.2 9.0 8.7*  MG  --   --  1.8   GFR: Estimated Creatinine Clearance: 113.9 mL/min (by C-G formula based on SCr of 0.83 mg/dL). Liver Function Tests: No results for input(s): AST, ALT, ALKPHOS, BILITOT, PROT, ALBUMIN in the last 168 hours. No results for input(s): LIPASE, AMYLASE in the last 168 hours. No results for input(s): AMMONIA in the last 168 hours. Coagulation Profile: No results for input(s): INR, PROTIME in the last 168 hours. Cardiac Enzymes: No results for input(s): CKTOTAL, CKMB, CKMBINDEX, TROPONINI in the last 168 hours. BNP (last 3 results) No results for input(s): PROBNP in the last 8760 hours. HbA1C: No  results for input(s): HGBA1C in the last 72 hours. CBG: Recent Labs  Lab 05/10/20 0820 05/10/20 1714 05/10/20 1941 05/11/20 0034 05/11/20 0406  GLUCAP 209* 206* 213* 198* 175*   Lipid Profile: Recent Labs    05/11/20 0820  TRIG 143   Thyroid Function Tests: No results for input(s): TSH, T4TOTAL, FREET4, T3FREE, THYROIDAB in the last 72 hours. Anemia Panel: No results for input(s): VITAMINB12, FOLATE, FERRITIN, TIBC, IRON, RETICCTPCT in the last 72 hours. Sepsis Labs: Recent Labs  Lab 05/11/20 0448  PROCALCITON <0.10    Recent Results (from the past 240 hour(s))  SARS CORONAVIRUS 2 (TAT 6-24 HRS) Nasopharyngeal Nasopharyngeal Swab     Status: None   Collection Time: 05/09/20  5:16 PM   Specimen: Nasopharyngeal Swab  Result Value Ref Range Status   SARS Coronavirus 2 NEGATIVE NEGATIVE Final    Comment: (NOTE) SARS-CoV-2 target nucleic acids are NOT DETECTED.  The SARS-CoV-2 RNA is generally detectable in upper and lower respiratory specimens during the acute phase of infection. Negative results do not preclude SARS-CoV-2 infection, do not rule out co-infections with other pathogens, and should not be used as the sole basis for treatment or other patient management decisions.  Negative results must be combined with clinical observations, patient history, and epidemiological information. The expected result is Negative.  Fact Sheet for Patients: SugarRoll.be  Fact Sheet for Healthcare Providers: https://www.woods-mathews.com/  This test is not yet approved or cleared by the [place] FDA and  has been authorized for detection and/or diagnosis of SARS-CoV-2 by FDA under an Emergency Use Authorization (EUA). This EUA will remain  in effect (meaning this test can be used) for the duration of the COVID-19 declaration under Se ction 564(b)(1) of the Act, 21 U.S.C. section 360bbb-3(b)(1), unless the authorization is terminated  or revoked sooner.  Performed at [facility], [place] [address] [place], [place] [address]   MRSA PCR Screening     Status: None   Collection Time: 05/10/20  1:28 PM   Specimen: Nasal Mucosa; Nasopharyngeal  Result Value Ref Range Status   MRSA by PCR NEGATIVE NEGATIVE Final    Comment:        The GeneXpert MRSA Assay (FDA approved for NASAL specimens only), is one component of a comprehensive MRSA colonization surveillance program. It is not intended to diagnose MRSA infection nor to guide or monitor treatment for MRSA infections. Performed at [facility], [address] [place],  [address]          Radiology Studies: DG CHEST PORT 1 VIEW  Result Date: 05/10/2020 CLINICAL DATA:  Intubation. EXAM: PORTABLE CHEST 1 VIEW COMPARISON:  Chest x-ray 05/09/2020. FINDINGS: Endotracheal tube and NG tube in stable position. Heart size normal. Left base atelectasis/infiltrate. Tiny left pleural effusion. No pneumothorax. Degenerative changes scoliosis thoracic spine. IMPRESSION: 1. Endotracheal tube and NG tube in stable position. 2. Left base atelectasis/infiltrate. Tiny left pleural effusion. Electronically Signed   By: Marcello Moores  Register   On: 05/10/2020 05:31   DG Chest Port 1 View  Result Date: 05/09/2020 CLINICAL DATA:  Tube placement EXAM: PORTABLE CHEST 1 VIEW COMPARISON:  08/08/2019 FINDINGS: Endotracheal tube is 7 cm above the carina. Cardiomegaly. Low lung volumes. Left base atelectasis or infiltrate. No confluent opacity on the right. No effusions or pneumothorax. IMPRESSION: Endotracheal tube 7 cm above the carina. Low lung volumes, left base atelectasis or infiltrate. Electronically Signed   By: Rolm Baptise M.D.   On: 05/09/2020 19:04   DG Chest Port 1V same Day  Result Date: 05/09/2020 CLINICAL DATA:  Allergic reaction, intubated EXAM: PORTABLE CHEST 1 VIEW COMPARISON:  05/09/2020 at 6:53 p.m. FINDINGS: Single frontal view of the chest demonstrates endotracheal  tube overlying tracheal air column tip at level of thoracic inlet. Enteric catheter passes below diaphragm, tip excluded by collimation. Progressive consolidation at the left lung base may reflect hypoventilatory change. No large effusion or pneumothorax. No acute bony abnormality. IMPRESSION: 1. Support devices as above. 2. Slight progression of left basilar atelectasis. Electronically Signed   By: Randa Ngo M.D.   On: 05/09/2020 22:49        Scheduled Meds: . chlorhexidine gluconate (MEDLINE KIT)  15 mL Mouth Rinse BID  . Chlorhexidine Gluconate Cloth  6 each Topical Daily  . docusate  100 mg Per Tube BID  . enoxaparin (LOVENOX) injection  65 mg Subcutaneous QHS  . mouth rinse  15 mL Mouth Rinse 10 times per day  . methylPREDNISolone (SOLU-MEDROL) injection  80 mg Intravenous Q12H  . pantoprazole (PROTONIX) IV  40 mg Intravenous Q24H  . polyethylene glycol  17 g Per Tube Daily   Continuous Infusions: . sodium chloride 75 mL/hr at 05/11/20 0339  .  fentaNYL infusion INTRAVENOUS 125 mcg/hr (05/11/20 0359)  . propofol (DIPRIVAN) infusion 20 mcg/kg/min (05/11/20 0900)     LOS: 2 days    Time spent: 35 minutes    Lissette Schenk Darleen Crocker, DO [facility]  If 7PM-7AM, please contact night-coverage [email or web address] 05/11/2020, 10:20 AM

## 2020-05-12 DIAGNOSIS — I129 Hypertensive chronic kidney disease with stage 1 through stage 4 chronic kidney disease, or unspecified chronic kidney disease: Secondary | ICD-10-CM | POA: Diagnosis not present

## 2020-05-12 DIAGNOSIS — M160 Bilateral primary osteoarthritis of hip: Secondary | ICD-10-CM | POA: Diagnosis not present

## 2020-05-12 DIAGNOSIS — I10 Essential (primary) hypertension: Secondary | ICD-10-CM

## 2020-05-12 DIAGNOSIS — R945 Abnormal results of liver function studies: Secondary | ICD-10-CM | POA: Diagnosis not present

## 2020-05-12 DIAGNOSIS — J988 Other specified respiratory disorders: Secondary | ICD-10-CM | POA: Diagnosis not present

## 2020-05-12 DIAGNOSIS — J9601 Acute respiratory failure with hypoxia: Secondary | ICD-10-CM | POA: Diagnosis not present

## 2020-05-12 DIAGNOSIS — R972 Elevated prostate specific antigen [PSA]: Secondary | ICD-10-CM | POA: Diagnosis not present

## 2020-05-12 DIAGNOSIS — T783XXS Angioneurotic edema, sequela: Secondary | ICD-10-CM | POA: Diagnosis not present

## 2020-05-12 DIAGNOSIS — E785 Hyperlipidemia, unspecified: Secondary | ICD-10-CM | POA: Diagnosis not present

## 2020-05-12 LAB — GLUCOSE, CAPILLARY
Glucose-Capillary: 127 mg/dL — ABNORMAL HIGH (ref 70–99)
Glucose-Capillary: 146 mg/dL — ABNORMAL HIGH (ref 70–99)
Glucose-Capillary: 161 mg/dL — ABNORMAL HIGH (ref 70–99)
Glucose-Capillary: 172 mg/dL — ABNORMAL HIGH (ref 70–99)
Glucose-Capillary: 185 mg/dL — ABNORMAL HIGH (ref 70–99)
Glucose-Capillary: 193 mg/dL — ABNORMAL HIGH (ref 70–99)

## 2020-05-12 LAB — CBC
HCT: 33.5 % — ABNORMAL LOW (ref 39.0–52.0)
Hemoglobin: 11.4 g/dL — ABNORMAL LOW (ref 13.0–17.0)
MCH: 30.8 pg (ref 26.0–34.0)
MCHC: 34 g/dL (ref 30.0–36.0)
MCV: 90.5 fL (ref 80.0–100.0)
Platelets: 241 10*3/uL (ref 150–400)
RBC: 3.7 MIL/uL — ABNORMAL LOW (ref 4.22–5.81)
RDW: 13.2 % (ref 11.5–15.5)
WBC: 16.2 10*3/uL — ABNORMAL HIGH (ref 4.0–10.5)
nRBC: 0 % (ref 0.0–0.2)

## 2020-05-12 LAB — BASIC METABOLIC PANEL
Anion gap: 11 (ref 5–15)
BUN: 29 mg/dL — ABNORMAL HIGH (ref 8–23)
CO2: 21 mmol/L — ABNORMAL LOW (ref 22–32)
Calcium: 8.3 mg/dL — ABNORMAL LOW (ref 8.9–10.3)
Chloride: 107 mmol/L (ref 98–111)
Creatinine, Ser: 1.22 mg/dL (ref 0.61–1.24)
GFR, Estimated: >60 mL/min (ref >60)
Glucose, Bld: 217 mg/dL — ABNORMAL HIGH (ref 70–99)
Potassium: 3.6 mmol/L (ref 3.5–5.1)
Sodium: 139 mmol/L (ref 135–145)

## 2020-05-12 LAB — MAGNESIUM: Magnesium: 1.9 mg/dL (ref 1.7–2.4)

## 2020-05-12 MED ORDER — CHLORHEXIDINE GLUCONATE 0.12 % MT SOLN
15.0000 mL | Freq: Two times a day (BID) | OROMUCOSAL | Status: DC
  Administered 2020-05-12 – 2020-05-18 (×13): 15 mL via OROMUCOSAL
  Filled 2020-05-12 (×10): qty 15

## 2020-05-12 MED ORDER — DIPHENHYDRAMINE HCL 50 MG/ML IJ SOLN
25.0000 mg | Freq: Three times a day (TID) | INTRAMUSCULAR | Status: DC
  Administered 2020-05-12 – 2020-05-14 (×6): 25 mg via INTRAVENOUS
  Filled 2020-05-12 (×6): qty 1

## 2020-05-12 MED ORDER — LACTATED RINGERS IV BOLUS
1000.0000 mL | Freq: Once | INTRAVENOUS | Status: AC
  Administered 2020-05-12: 1000 mL via INTRAVENOUS

## 2020-05-12 MED ORDER — FAMOTIDINE IN NACL 20-0.9 MG/50ML-% IV SOLN
20.0000 mg | Freq: Two times a day (BID) | INTRAVENOUS | Status: DC
  Administered 2020-05-12 (×2): 20 mg via INTRAVENOUS
  Filled 2020-05-12 (×3): qty 50

## 2020-05-12 MED ORDER — ORAL CARE MOUTH RINSE
15.0000 mL | Freq: Two times a day (BID) | OROMUCOSAL | Status: DC
  Administered 2020-05-13 – 2020-05-18 (×12): 15 mL via OROMUCOSAL

## 2020-05-12 MED ORDER — PREDNISONE 20 MG PO TABS
40.0000 mg | ORAL_TABLET | Freq: Every day | ORAL | Status: AC
  Administered 2020-05-13 – 2020-05-15 (×3): 40 mg
  Filled 2020-05-12 (×3): qty 2

## 2020-05-12 NOTE — Procedures (Signed)
Extubation Procedure Note  Patient Details:   Name: Bernard Roy DOB: [birth year] MRN: [id number]   Airway Documentation:    Vent end date: 05/12/20 Vent end time: 1300   Evaluation  O2 sats: stable throughout Complications: No apparent complications Patient did tolerate procedure well. Bilateral Breath Sounds: Diminished   Yes   Patient extubated to 2L nasal cannula per MD order.  Positive cuff leak noted.  No evidence of stridor.  Patient able to speak post extubation.  Sats currently 100%.  Vitals are stable.  No complications noted.   Judith Part 05/12/2020, 1:20 PM

## 2020-05-12 NOTE — Progress Notes (Signed)
NAME:  Bernard Roy, MRN:  [id number], DOB:  Oct 27, 1945, LOS: 3 ADMISSION DATE:  05/09/2020, CONSULTATION DATE:  1/27 REFERRING MD:  Hassell Halim  CHIEF COMPLAINT:  resp distress on ACEi   Brief History:  75 yo male former smoker on ACEi with acute tongue swelling starting pm 1/25 and requiring endotracheal intubation in ER. PCCM was consulted for further management      Past Medical History:   Past Medical History:  Diagnosis Date  . Chronic back pain   . Detached retina   . Glaucoma   . Hip pain   . HTN (hypertension)   . Hypercholesterolemia   . Memory loss    per sister. Not officially diagnosed with dementia. Able to remember short term and long term but sometimes mentions things that may not have happened and sister does not recognize  . Prostate cancer ([place])    recurrent. Radiation with first episode.      Significant Hospital Events:  ETT 1/26  Consults:  PCCM  Procedures:    Significant Diagnostic Tests:     Micro Data:  Covid 19 PCR  1/26  Neg  MRA PCR  1/26 neg  ET  05/12/2020 >>>  Antimicrobials:     Scheduled Meds: . chlorhexidine gluconate (MEDLINE KIT)  15 mL Mouth Rinse BID  . Chlorhexidine Gluconate Cloth  6 each Topical Daily  . diphenhydrAMINE  25 mg Intravenous Q8H  . docusate  100 mg Per Tube BID  . enoxaparin (LOVENOX) injection  65 mg Subcutaneous QHS  . mouth rinse  15 mL Mouth Rinse 10 times per day  . polyethylene glycol  17 g Per Tube Daily  . [START ON 05/13/2020] predniSONE  40 mg Per Tube Q breakfast  . sodium chloride flush  10-40 mL Intracatheter Q12H   Continuous Infusions: . sodium chloride 75 mL/hr at 05/12/20 0600  . famotidine (PEPCID) IV    . fentaNYL infusion INTRAVENOUS 125 mcg/hr (05/12/20 0600)  . lactated ringers     PRN Meds:.acetaminophen **OR** acetaminophen, fentaNYL (SUBLIMAZE) injection, labetalol, ondansetron **OR** ondansetron (ZOFRAN) IV, polyethylene glycol, sodium chloride flush   Interim History /  Subjective:  Patient remained on deep sedation, I evaluated patient with a glide scope showed reduced tongue swelling there is no swelling of oral pharynx/ larynx  Objective   Blood pressure 123/65, pulse (!) 48, temperature 97.7 F (36.5 C), temperature source Axillary, resp. rate 16, height '6\' 2"'  (1.88 m), weight 131 kg, SpO2 100 %.    Vent Mode: PRVC FiO2 (%):  [40 %] 40 % Set Rate:  [16 bmp] 16 bmp Vt Set:  [560 mL-650 mL] 650 mL PEEP:  [5 cmH20] 5 cmH20 Plateau Pressure:  [17 cmH20-21 cmH20] 18 cmH20   Intake/Output Summary (Last 24 hours) at 05/12/2020 1035 Last data filed at 05/12/2020 3094 Gross per 24 hour  Intake 2790.4 ml  Output 1150 ml  Net 1640.4 ml   Filed Weights   05/09/20 1647 05/10/20 1318 05/12/20 0400  Weight: 127 kg 134.4 kg 131 kg    Examination: General: Elderly, obese Male, lying in the bed Oropharynx -tongue is slightly swollen, lips are not enlarged. Oropharyngeal exam via glide scope showed no swelling. Neck supple Lungs: Clear to auscultation bilaterally, no rhonchi or wheezes Heart: Regular rate and rhythm, no murmur appreciated Abd obese, soft, nontender, nondistended Ext  warm with no edema or clubbing noted.  No skin lesions or rashes appreciated Neuro: Sedated, eyes closed, not following commands  Assessment & Plan:   Acute hypoxic respiratory failure due to angioedema from ACE inhibitor Angioedema Hypertension Hyperlipidemia Acute kidney injury Morbid obesity  Patient tongue is still swollen but oropharynx looks okay on glide scope examination Continue lung protective ventilation Stop sedation, once patient is awake, will do pressure support trial Continue prednisone 40 mg once daily Started on Benadryl and famotidine Hold ACE inhibitor Holding other antihypertensive meds to Patient serum creatinine at baseline is 0.7, now it is more than 1 We will give him 1 L of LR, monitor urine output Repeat serum creatinine in the  morning Keep n.p.o. Nutritionist follow-up   Best practice (evaluated daily)  Diet: NPO Pain/Anxiety/Delirium protocol (if indicated): Stop sedation VAP protocol (if indicated): Ordered DVT prophylaxis: lovenox GI prophylaxis: pepcid Glucose control: SSI Mobility: Bedrest, then as tolerated after extubation Disposition: ICU CODE STATUS: Full code  Goals of Care:  Last date of multidisciplinary goals of care discussion:  Family and staff present:  Summary of discussion:  Follow up goals of care discussion due:  Code Status: Full code   Labs   CBC: Recent Labs  Lab 05/09/20 1845 05/10/20 0538 05/11/20 0448 05/12/20 0354  WBC 14.3* 10.8* 13.1* 16.2*  HGB 12.5* 11.6* 12.4* 11.4*  HCT 39.5 35.2* 37.4* 33.5*  MCV 94.3 92.1 91.4 90.5  PLT 285 240 243 030    Basic Metabolic Panel: Recent Labs  Lab 05/09/20 1920 05/10/20 0538 05/11/20 0448 05/12/20 0354  NA 138 134* 136 139  K 4.2 3.8 3.8 3.6  CL 103 102 105 107  CO2 '27 23 22 ' 21*  GLUCOSE 143* 235* 226* 217*  BUN '14 18 20 ' 29*  CREATININE 0.77 0.86 0.83 1.22  CALCIUM 9.2 9.0 8.7* 8.3*  MG  --   --  1.8 1.9   GFR: Estimated Creatinine Clearance: 76.4 mL/min (by C-G formula based on SCr of 1.22 mg/dL). Recent Labs  Lab 05/09/20 1845 05/10/20 0538 05/11/20 0448 05/12/20 0354  PROCALCITON  --   --  <0.10  --   WBC 14.3* 10.8* 13.1* 16.2*    Liver Function Tests: No results for input(s): AST, ALT, ALKPHOS, BILITOT, PROT, ALBUMIN in the last 168 hours. No results for input(s): LIPASE, AMYLASE in the last 168 hours. No results for input(s): AMMONIA in the last 168 hours.  ABG    Component Value Date/Time   PHART 7.519 (H) 05/10/2020 0625   PCO2ART 30.2 (L) 05/10/2020 0625   PO2ART 159 (H) 05/10/2020 0625   HCO3 26.6 05/10/2020 0625   O2SAT 98.8 05/10/2020 0625     Coagulation Profile: No results for input(s): INR, PROTIME in the last 168 hours.  Cardiac Enzymes: No results for input(s): CKTOTAL,  CKMB, CKMBINDEX, TROPONINI in the last 168 hours.  HbA1C: No results found for: HGBA1C  CBG: Recent Labs  Lab 05/11/20 1118 05/11/20 1621 05/11/20 2316 05/12/20 0324 05/12/20 0828  GLUCAP 163* 168* 134* 193* 185*    Total critical care time: 39 minutes  Performed by: [place] care time was exclusive of separately billable procedures and treating other patients.   Critical care was necessary to treat or prevent imminent or life-threatening deterioration.   Critical care was time spent personally by me on the following activities: development of treatment plan with patient and/or surrogate as well as nursing, discussions with consultants, evaluation of patient's response to treatment, examination of patient, obtaining history from patient or surrogate, ordering and performing treatments and interventions, ordering and review of laboratory studies,  ordering and review of radiographic studies, pulse oximetry and re-evaluation of patient's condition.   Jacky Kindle MD [facility] See Amion for pager If no response to pager, please call [phone] until 7pm After 7pm, Please call E-link [phone]

## 2020-05-13 DIAGNOSIS — T783XXS Angioneurotic edema, sequela: Secondary | ICD-10-CM | POA: Diagnosis not present

## 2020-05-13 LAB — URINALYSIS, COMPLETE (UACMP) WITH MICROSCOPIC
Bilirubin Urine: NEGATIVE
Glucose, UA: NEGATIVE mg/dL
Ketones, ur: NEGATIVE mg/dL
Nitrite: POSITIVE — AB
Protein, ur: NEGATIVE mg/dL
Specific Gravity, Urine: 1.02 (ref 1.005–1.030)
pH: 6 (ref 5.0–8.0)

## 2020-05-13 LAB — BASIC METABOLIC PANEL
Anion gap: 10 (ref 5–15)
BUN: 25 mg/dL — ABNORMAL HIGH (ref 8–23)
CO2: 22 mmol/L (ref 22–32)
Calcium: 8.5 mg/dL — ABNORMAL LOW (ref 8.9–10.3)
Chloride: 110 mmol/L (ref 98–111)
Creatinine, Ser: 0.97 mg/dL (ref 0.61–1.24)
GFR, Estimated: >60 mL/min (ref >60)
Glucose, Bld: 133 mg/dL — ABNORMAL HIGH (ref 70–99)
Potassium: 3.3 mmol/L — ABNORMAL LOW (ref 3.5–5.1)
Sodium: 142 mmol/L (ref 135–145)

## 2020-05-13 LAB — CBC WITH DIFFERENTIAL/PLATELET
Abs Immature Granulocytes: 0.39 10*3/uL — ABNORMAL HIGH (ref 0.00–0.07)
Basophils Absolute: 0.1 10*3/uL (ref 0.0–0.1)
Basophils Relative: 0 %
Eosinophils Absolute: 0 10*3/uL (ref 0.0–0.5)
Eosinophils Relative: 0 %
HCT: 36.2 % — ABNORMAL LOW (ref 39.0–52.0)
Hemoglobin: 12 g/dL — ABNORMAL LOW (ref 13.0–17.0)
Immature Granulocytes: 2 %
Lymphocytes Relative: 4 %
Lymphs Abs: 0.6 10*3/uL — ABNORMAL LOW (ref 0.7–4.0)
MCH: 30.6 pg (ref 26.0–34.0)
MCHC: 33.1 g/dL (ref 30.0–36.0)
MCV: 92.3 fL (ref 80.0–100.0)
Monocytes Absolute: 1.4 10*3/uL — ABNORMAL HIGH (ref 0.1–1.0)
Monocytes Relative: 9 %
Neutro Abs: 13.7 10*3/uL — ABNORMAL HIGH (ref 1.7–7.7)
Neutrophils Relative %: 85 %
Platelets: 266 10*3/uL (ref 150–400)
RBC: 3.92 MIL/uL — ABNORMAL LOW (ref 4.22–5.81)
RDW: 13.8 % (ref 11.5–15.5)
WBC: 16.2 10*3/uL — ABNORMAL HIGH (ref 4.0–10.5)
nRBC: 0 % (ref 0.0–0.2)

## 2020-05-13 LAB — GLUCOSE, CAPILLARY
Glucose-Capillary: 128 mg/dL — ABNORMAL HIGH (ref 70–99)
Glucose-Capillary: 114 mg/dL — ABNORMAL HIGH (ref 70–99)
Glucose-Capillary: 118 mg/dL — ABNORMAL HIGH (ref 70–99)
Glucose-Capillary: 146 mg/dL — ABNORMAL HIGH (ref 70–99)

## 2020-05-13 LAB — BRAIN NATRIURETIC PEPTIDE: B Natriuretic Peptide: 89.2 pg/mL (ref 0.0–100.0)

## 2020-05-13 MED ORDER — FAMOTIDINE 20 MG PO TABS
20.0000 mg | ORAL_TABLET | Freq: Two times a day (BID) | ORAL | Status: DC
  Administered 2020-05-13 – 2020-05-18 (×11): 20 mg via ORAL
  Filled 2020-05-13 (×11): qty 1

## 2020-05-13 MED ORDER — TAMSULOSIN HCL 0.4 MG PO CAPS
0.4000 mg | ORAL_CAPSULE | Freq: Two times a day (BID) | ORAL | Status: DC
  Administered 2020-05-13 – 2020-05-18 (×11): 0.4 mg via ORAL
  Filled 2020-05-13 (×11): qty 1

## 2020-05-13 MED ORDER — POTASSIUM CHLORIDE CRYS ER 20 MEQ PO TBCR
40.0000 meq | EXTENDED_RELEASE_TABLET | Freq: Once | ORAL | Status: AC
  Administered 2020-05-13: 40 meq via ORAL

## 2020-05-13 NOTE — Progress Notes (Signed)
Assisted tele visit to patient with family member.  Bernard Roy Harold, RN  

## 2020-05-13 NOTE — Evaluation (Addendum)
Clinical/Bedside Swallow Evaluation Patient Details  Name: Bernard Roy MRN: [id number] Date of Birth: 18-Jul-1945  Today's Date: 05/13/2020 Time: SLP Start Time (ACUTE ONLY): 9563 SLP Stop Time (ACUTE ONLY): 1440 SLP Time Calculation (min) (ACUTE ONLY): 7 min  Past Medical History:  Past Medical History:  Diagnosis Date  . Chronic back pain   . Detached retina   . Glaucoma   . Hip pain   . HTN (hypertension)   . Hypercholesterolemia   . Memory loss    per sister. Not officially diagnosed with dementia. Able to remember short term and long term but sometimes mentions things that may not have happened and sister does not recognize  . Prostate cancer ([place])    recurrent. Radiation with first episode.    Past Surgical History:  Past Surgical History:  Procedure Laterality Date  . COLONOSCOPY  2016   5 sessile polyps in ascending colon, 3 sessile polyps in transverse colon, 5 sessile polyps in sigmoid, and sigmoid diverticulosis. Path unknown  . TOTAL HIP ARTHROPLASTY     bilateral hips, X 2   HPI:  Patient is a 58 male with history of hypertension, hyperlipidemia, prostate cancer who initially presented to the emergency department at [facility] hospital with complaint of swelling of the tongue.  Found to have severe angioedema suspected to be from ACE inhibitor use and was intubated in the emergency department and was transferred to [facility].  ETT 1/26-1/29.  Per CC MD note 1/29: "Patient remained on deep sedation, I evaluated patient with a glide scope showed reduced tongue swelling there is no swelling of oral pharynx/ larynx" CXR 1/27: "Left base atelectasis/infiltrate. Tiny left pleural effusion"  Assessment / Plan / Recommendation Clinical Impression  Pt presents with functional swallowing as assessed clinically. Pt's vocal quality clear and strong on SLP arrival.  Pt is edentulous.  He states that his dentures are not present, but that he does not typically wear them with PO  intake. Pt tolerated all consistencies trialed with no clinical s/s of aspiration. Pt exhibited good oral clearance of regular solids.  Extubated over 24 hours prior.  Lingual edema has subsided, and per chart review there was no pharyngeal or laryngeal edema noted with glide scope.  Pt does not appear to have any acute ST needs.  SLP will sign off at this time.  Recommend regular texture diet with thin liquid.   SLP Visit Diagnosis: Dysphagia, unspecified (R13.10)    Aspiration Risk  No limitations    Diet Recommendation Regular;Thin liquid   Liquid Administration via: Cup;Straw Medication Administration: Whole meds with liquid Supervision: Patient able to self feed Compensations: Small sips/bites;Slow rate Postural Changes: Seated upright at 90 degrees    Other  Recommendations Oral Care Recommendations: Oral care BID   Follow up Recommendations None      Frequency and Duration  (N/A)          Prognosis Prognosis for Safe Diet Advancement:  (N/A)      Swallow Study   General HPI: Patient is a 8 male with history of hypertension, hyperlipidemia, prostate cancer who initially presented to the emergency department at [facility] hospital with complaint of swelling of the tongue.  Found to have severe angioedema suspected to be from ACE inhibitor use and was intubated in the emergency department and was transferred to [facility].  ETT 1/26-1/29.  Per CC MD note 1/29: "Patient remained on deep sedation, I evaluated patient with a glide scope showed reduced tongue swelling there is  no swelling of oral pharynx/ larynx" Type of Study: Bedside Swallow Evaluation Previous Swallow Assessment: none Diet Prior to this Study: Thin liquids (Full liquids) Temperature Spikes Noted: No Respiratory Status: Room air History of Recent Intubation: Yes Length of Intubations (days): 3 days Date extubated: 05/12/20 Behavior/Cognition: Alert;Cooperative;Pleasant mood Oral Cavity Assessment: Within  Functional Limits Oral Care Completed by SLP: No Oral Cavity - Dentition: Edentulous;Dentures, not available Self-Feeding Abilities: Able to feed self Patient Positioning: Upright in bed Baseline Vocal Quality: Normal Volitional Cough: Strong Volitional Swallow: Able to elicit    Oral/Motor/Sensory Function Overall Oral Motor/Sensory Function: Mild impairment Facial ROM: Within Functional Limits Facial Symmetry: Within Functional Limits Lingual ROM: Within Functional Limits Lingual Symmetry: Abnormal symmetry right Lingual Strength: Within Functional Limits Velum: Within Functional Limits Mandible: Within Functional Limits   Ice Chips Ice chips: Not tested   Thin Liquid Thin Liquid: Within functional limits Presentation: Cup;Straw    Nectar Thick Nectar Thick Liquid: Not tested   Honey Thick Honey Thick Liquid: Not tested   Puree Puree: Within functional limits Presentation: Spoon   Solid     Solid: Within functional limits Presentation: [place], [place], [place] Office: [phone]; Pager (1/30): [phone] 05/13/2020,2:53 PM

## 2020-05-13 NOTE — Progress Notes (Signed)
PROGRESS NOTE    Bernard Roy  AST:[id number] DOB: November 28, 1945 DOA: 05/09/2020 PCP: [facility]   Chief Complain: Swelling of the tongue  Brief Narrative: Patient is a 87 male with history of hypertension, hyperlipidemia, prostate cancer who initially presented to the emergency department at [facility] hospital with complaint of swelling of the tongue.  Found to have severe angioedema suspected to be from ACE inhibitor use and was intubated in the emergency department and was transferred to [facility].  He was extubated on 05/12/2020.  Assessment & Plan:   Principal Problem:   Angioedema Active Problems:   HTN (hypertension)   Acute respiratory failure with hypoxemia (HCC)   Airway obstruction   Pressure injury of skin  Acute respiratory failure: Secondary to progressive angioedema.  Intubated for airway protection.  Currently successfully extubated.  Respiratory status is currently stable.  Angioedema: Suspected to be from lisinopril he was taking.  Continue prednisone, H1, H2 blockers.  Start on full liquid diet.  Requested discharge therapy evaluation. Patient looks volume overloaded, BNP normal.  Stopped IV fluids.  Hypertension: Currently blood pressure stable.  On Norvasc and lisinopril at home.  We will permanently discontinue lisinopril for suspected angioedema.  Hyperlipidemia: On statin at home  Leukocytosis: Most likely secondary to steroids/reactive.  No indication for antibiotic therapy  Morbid obesity: BMI of 37.08  Nutrition Problem: Inadequate oral intake Etiology: inability to eat      DVT prophylaxis:lovenox Code Status: Full Family Communication: None at bed side Status is: Inpatient  Remains inpatient appropriate because:Inpatient level of care appropriate due to severity of illness   Dispo: The patient is from: Home              Anticipated d/c is to: Home              Anticipated d/c date is: 1 day              Patient currently is  not medically stable to d/c.   Difficult to place patient No     Consultants: PCCM  Procedures:Intubation  Antimicrobials:  Anti-infectives (From admission, onward)   None      Subjective:  Patient seen and examined the bedside this morning.  Hemodynamically stable.  Denies any shortness of breath.  No swelling of the tongue or lips noticed today.  Denies any complaints.   Objective: Vitals:   05/13/20 0400 05/13/20 0419 05/13/20 0500 05/13/20 0600  BP: (!) 148/64  (!) 148/74 129/70  Pulse: 76  68 66  Resp: (!) 22  13 11   Temp:  97.6 F (36.4 C)    TempSrc:  Axillary    SpO2: 96%  100% 99%  Weight:      Height:        Intake/Output Summary (Last 24 hours) at 05/13/2020 0746 Last data filed at 05/13/2020 0600 Gross per 24 hour  Intake 1845.64 ml  Output 1275 ml  Net 570.64 ml   Filed Weights   05/09/20 1647 05/10/20 1318 05/12/20 0400  Weight: 127 kg 134.4 kg 131 kg    Examination:  General exam: Morbidly obese, comfortable HEENT:PERRL,Oral mucosa moist, Ear/Nose normal on gross exam Respiratory system: Bilateral equal air entry, normal vesicular breath sounds, no wheezes or crackles  Cardiovascular system: S1 & S2 heard, RRR. No JVD, murmurs, rubs, gallops or clicks.  Gastrointestinal system: Abdomen is nondistended, soft and nontender. No organomegaly or masses felt. Normal bowel sounds heard. Central nervous system: Alert and oriented. No focal  neurological deficits. Extremities: 1-2+ bilateral lower extremity pitting edema, no clubbing ,no cyanosis Skin: No rashes, lesions or ulcers,no icterus ,no pallor GU: Foley  Data Reviewed: I have personally reviewed following labs and imaging studies  CBC: Recent Labs  Lab 05/09/20 1845 05/10/20 0538 05/11/20 0448 05/12/20 0354  WBC 14.3* 10.8* 13.1* 16.2*  HGB 12.5* 11.6* 12.4* 11.4*  HCT 39.5 35.2* 37.4* 33.5*  MCV 94.3 92.1 91.4 90.5  PLT 285 240 243 062   Basic Metabolic Panel: Recent Labs  Lab  05/09/20 1920 05/10/20 0538 05/11/20 0448 05/12/20 0354  NA 138 134* 136 139  K 4.2 3.8 3.8 3.6  CL 103 102 105 107  CO2 27 23 22  21*  GLUCOSE 143* 235* 226* 217*  BUN 14 18 20  29*  CREATININE 0.77 0.86 0.83 1.22  CALCIUM 9.2 9.0 8.7* 8.3*  MG  --   --  1.8 1.9   GFR: Estimated Creatinine Clearance: 76.4 mL/min (by C-G formula based on SCr of 1.22 mg/dL). Liver Function Tests: No results for input(s): AST, ALT, ALKPHOS, BILITOT, PROT, ALBUMIN in the last 168 hours. No results for input(s): LIPASE, AMYLASE in the last 168 hours. No results for input(s): AMMONIA in the last 168 hours. Coagulation Profile: No results for input(s): INR, PROTIME in the last 168 hours. Cardiac Enzymes: No results for input(s): CKTOTAL, CKMB, CKMBINDEX, TROPONINI in the last 168 hours. BNP (last 3 results) No results for input(s): PROBNP in the last 8760 hours. HbA1C: No results for input(s): HGBA1C in the last 72 hours. CBG: Recent Labs  Lab 05/12/20 1306 05/12/20 1642 05/12/20 2002 05/12/20 2347 05/13/20 0408  GLUCAP 172* 161* 146* 127* 128*   Lipid Profile: Recent Labs    05/11/20 0820  TRIG 143   Thyroid Function Tests: No results for input(s): TSH, T4TOTAL, FREET4, T3FREE, THYROIDAB in the last 72 hours. Anemia Panel: No results for input(s): VITAMINB12, FOLATE, FERRITIN, TIBC, IRON, RETICCTPCT in the last 72 hours. Sepsis Labs: Recent Labs  Lab 05/11/20 0448  PROCALCITON <0.10    Recent Results (from the past 240 hour(s))  SARS CORONAVIRUS 2 (TAT 6-24 HRS) Nasopharyngeal Nasopharyngeal Swab     Status: None   Collection Time: 05/09/20  5:16 PM   Specimen: Nasopharyngeal Swab  Result Value Ref Range Status   SARS Coronavirus 2 NEGATIVE NEGATIVE Final    Comment: (NOTE) SARS-CoV-2 target nucleic acids are NOT DETECTED.  The SARS-CoV-2 RNA is generally detectable in upper and lower respiratory specimens during the acute phase of infection. Negative results do not  preclude SARS-CoV-2 infection, do not rule out co-infections with other pathogens, and should not be used as the sole basis for treatment or other patient management decisions. Negative results must be combined with clinical observations, patient history, and epidemiological information. The expected result is Negative.  Fact Sheet for Patients: SugarRoll.be  Fact Sheet for Healthcare Providers: https://www.woods-mathews.com/  This test is not yet approved or cleared by the [place] FDA and  has been authorized for detection and/or diagnosis of SARS-CoV-2 by FDA under an Emergency Use Authorization (EUA). This EUA will remain  in effect (meaning this test can be used) for the duration of the COVID-19 declaration under Se ction 564(b)(1) of the Act, 21 U.S.C. section 360bbb-3(b)(1), unless the authorization is terminated or revoked sooner.  Performed at [facility], [place] [address], [place], [place] [address]   MRSA PCR Screening     Status: None   Collection Time: 05/10/20  1:28 PM  Specimen: Nasal Mucosa; Nasopharyngeal  Result Value Ref Range Status   MRSA by PCR NEGATIVE NEGATIVE Final    Comment:        The GeneXpert MRSA Assay (FDA approved for NASAL specimens only), is one component of a comprehensive MRSA colonization surveillance program. It is not intended to diagnose MRSA infection nor to guide or monitor treatment for MRSA infections. Performed at [facility], [address] [place], [place] [address]   Culture, blood (routine x 2)     Status: None (Preliminary result)   Collection Time: 05/11/20  1:15 PM   Specimen: BLOOD LEFT WRIST  Result Value Ref Range Status   Specimen Description BLOOD LEFT WRIST  Final   Special Requests   Final    BOTTLES DRAWN AEROBIC AND ANAEROBIC Blood Culture adequate volume   Culture   Final    NO GROWTH 2 DAYS Performed at [facility], [address] [place], [place]  [address]    Report Status PENDING  Incomplete  Culture, blood (routine x 2)     Status: None (Preliminary result)   Collection Time: 05/11/20  1:17 PM   Specimen: BLOOD  Result Value Ref Range Status   Specimen Description BLOOD LEFT ANTECUBITAL  Final   Special Requests   Final    BOTTLES DRAWN AEROBIC AND ANAEROBIC Blood Culture adequate volume   Culture   Final    NO GROWTH 2 DAYS Performed at [facility], [address], [place], [place] [address]    Report Status PENDING  Incomplete         Radiology Studies: [place] EKG SITE RITE  Result Date: 05/11/2020 If Site Rite image not attached, placement could not be confirmed due to current cardiac rhythm.       Scheduled Meds: . chlorhexidine  15 mL Mouth Rinse BID  . Chlorhexidine Gluconate Cloth  6 each Topical Daily  . diphenhydrAMINE  25 mg Intravenous Q8H  . docusate  100 mg Per Tube BID  . enoxaparin (LOVENOX) injection  65 mg Subcutaneous QHS  . mouth rinse  15 mL Mouth Rinse q12n4p  . polyethylene glycol  17 g Per Tube Daily  . predniSONE  40 mg Per Tube Q breakfast  . sodium chloride flush  10-40 mL Intracatheter Q12H   Continuous Infusions: . sodium chloride 75 mL/hr at 05/13/20 0600  . famotidine (PEPCID) IV Stopped (05/12/20 2246)  . fentaNYL infusion INTRAVENOUS 50 mcg/hr (05/12/20 1100)     LOS: 4 days    Time spent: 35 mins.More than 50% of that time was spent in counseling and/or coordination of care.      Shelly Coss, MD [facility] P1/30/2022, 7:46 AM

## 2020-05-14 ENCOUNTER — Inpatient Hospital Stay (HOSPITAL_COMMUNITY): Payer: No Typology Code available for payment source

## 2020-05-14 DIAGNOSIS — I5030 Unspecified diastolic (congestive) heart failure: Secondary | ICD-10-CM

## 2020-05-14 DIAGNOSIS — T783XXS Angioneurotic edema, sequela: Secondary | ICD-10-CM | POA: Diagnosis not present

## 2020-05-14 LAB — CBC WITH DIFFERENTIAL/PLATELET
Abs Immature Granulocytes: 0.51 10*3/uL — ABNORMAL HIGH (ref 0.00–0.07)
Basophils Absolute: 0.1 10*3/uL (ref 0.0–0.1)
Basophils Relative: 0 %
Eosinophils Absolute: 0.1 10*3/uL (ref 0.0–0.5)
Eosinophils Relative: 0 %
HCT: 32.8 % — ABNORMAL LOW (ref 39.0–52.0)
Hemoglobin: 11 g/dL — ABNORMAL LOW (ref 13.0–17.0)
Immature Granulocytes: 4 %
Lymphocytes Relative: 6 %
Lymphs Abs: 0.7 10*3/uL (ref 0.7–4.0)
MCH: 31.1 pg (ref 26.0–34.0)
MCHC: 33.5 g/dL (ref 30.0–36.0)
MCV: 92.7 fL (ref 80.0–100.0)
Monocytes Absolute: 1 10*3/uL (ref 0.1–1.0)
Monocytes Relative: 8 %
Neutro Abs: 9.8 10*3/uL — ABNORMAL HIGH (ref 1.7–7.7)
Neutrophils Relative %: 82 %
Platelets: 226 10*3/uL (ref 150–400)
RBC: 3.54 MIL/uL — ABNORMAL LOW (ref 4.22–5.81)
RDW: 13.7 % (ref 11.5–15.5)
WBC: 12.1 10*3/uL — ABNORMAL HIGH (ref 4.0–10.5)
nRBC: 0.2 % (ref 0.0–0.2)

## 2020-05-14 LAB — ECHOCARDIOGRAM COMPLETE
Area-P 1/2: 6.96 cm2
Height: 74 in
S' Lateral: 3.2 cm
Weight: 4620.84 oz

## 2020-05-14 LAB — HEMOGLOBIN A1C
Hgb A1c MFr Bld: 7.1 % — ABNORMAL HIGH (ref 4.8–5.6)
Mean Plasma Glucose: 157.07 mg/dL

## 2020-05-14 LAB — BASIC METABOLIC PANEL
Anion gap: 9 (ref 5–15)
BUN: 19 mg/dL (ref 8–23)
CO2: 23 mmol/L (ref 22–32)
Calcium: 8.2 mg/dL — ABNORMAL LOW (ref 8.9–10.3)
Chloride: 110 mmol/L (ref 98–111)
Creatinine, Ser: 0.91 mg/dL (ref 0.61–1.24)
GFR, Estimated: >60 mL/min (ref >60)
Glucose, Bld: 143 mg/dL — ABNORMAL HIGH (ref 70–99)
Potassium: 3.3 mmol/L — ABNORMAL LOW (ref 3.5–5.1)
Sodium: 142 mmol/L (ref 135–145)

## 2020-05-14 LAB — GLUCOSE, CAPILLARY
Glucose-Capillary: 123 mg/dL — ABNORMAL HIGH (ref 70–99)
Glucose-Capillary: 132 mg/dL — ABNORMAL HIGH (ref 70–99)
Glucose-Capillary: 139 mg/dL — ABNORMAL HIGH (ref 70–99)
Glucose-Capillary: 190 mg/dL — ABNORMAL HIGH (ref 70–99)
Glucose-Capillary: 216 mg/dL — ABNORMAL HIGH (ref 70–99)

## 2020-05-14 LAB — TRIGLYCERIDES: Triglycerides: 153 mg/dL — ABNORMAL HIGH (ref <150)

## 2020-05-14 MED ORDER — INSULIN ASPART 100 UNIT/ML ~~LOC~~ SOLN
0.0000 [IU] | Freq: Every day | SUBCUTANEOUS | Status: DC
  Administered 2020-05-15 – 2020-05-16 (×2): 2 [IU] via SUBCUTANEOUS

## 2020-05-14 MED ORDER — PERFLUTREN LIPID MICROSPHERE
1.0000 mL | INTRAVENOUS | Status: AC | PRN
  Administered 2020-05-14: 2 mL via INTRAVENOUS
  Filled 2020-05-14: qty 10

## 2020-05-14 MED ORDER — ENSURE ENLIVE PO LIQD
237.0000 mL | Freq: Three times a day (TID) | ORAL | Status: DC
  Administered 2020-05-14 – 2020-05-18 (×11): 237 mL via ORAL

## 2020-05-14 MED ORDER — POTASSIUM CHLORIDE CRYS ER 20 MEQ PO TBCR
40.0000 meq | EXTENDED_RELEASE_TABLET | Freq: Once | ORAL | Status: AC
  Administered 2020-05-14: 40 meq via ORAL
  Filled 2020-05-14: qty 2

## 2020-05-14 MED ORDER — FUROSEMIDE 10 MG/ML IJ SOLN
40.0000 mg | Freq: Two times a day (BID) | INTRAMUSCULAR | Status: DC
  Administered 2020-05-14 – 2020-05-15 (×3): 40 mg via INTRAVENOUS
  Filled 2020-05-14 (×3): qty 4

## 2020-05-14 MED ORDER — DIPHENHYDRAMINE HCL 25 MG PO CAPS: 25.0000 mg | ORAL_CAPSULE | Freq: Four times a day (QID) | ORAL | Status: DC | PRN

## 2020-05-14 MED ORDER — SODIUM CHLORIDE 0.9 % IV SOLN
1.0000 g | INTRAVENOUS | Status: DC
  Administered 2020-05-14 – 2020-05-16 (×3): 1 g via INTRAVENOUS
  Filled 2020-05-14: qty 10
  Filled 2020-05-14 (×2): qty 1
  Filled 2020-05-14: qty 10

## 2020-05-14 MED ORDER — PROSOURCE PLUS PO LIQD
30.0000 mL | Freq: Two times a day (BID) | ORAL | Status: DC
  Administered 2020-05-15 – 2020-05-18 (×7): 30 mL via ORAL
  Filled 2020-05-14 (×10): qty 30

## 2020-05-14 MED ORDER — INSULIN ASPART 100 UNIT/ML ~~LOC~~ SOLN
0.0000 [IU] | Freq: Three times a day (TID) | SUBCUTANEOUS | Status: DC
  Administered 2020-05-14: 7 [IU] via SUBCUTANEOUS
  Administered 2020-05-15: 3 [IU] via SUBCUTANEOUS
  Administered 2020-05-15 (×2): 7 [IU] via SUBCUTANEOUS
  Administered 2020-05-16: 4 [IU] via SUBCUTANEOUS
  Administered 2020-05-16 – 2020-05-17 (×3): 3 [IU] via SUBCUTANEOUS
  Administered 2020-05-17 – 2020-05-18 (×4): 7 [IU] via SUBCUTANEOUS

## 2020-05-14 NOTE — Progress Notes (Signed)
Nutrition Follow-up  RD working remotely.  DOCUMENTATION CODES:   Morbid obesity  INTERVENTION:   - Ensure Enlive po TID, each supplement provides 350 kcal and 20 grams of protein  - ProSource Plus 30 ml po BID, each supplement provides 100 kcal and 15 grams of protein  NUTRITION DIAGNOSIS:   Inadequate oral intake related to inability to eat as evidenced by NPO status.  Progressing, pt now on full liquid diet  GOAL:   Patient will meet greater than or equal to 90% of their needs  Progressing  MONITOR:   PO intake,Supplement acceptance,Diet advancement,Labs,Weight trends,Skin,I & O's  REASON FOR ASSESSMENT:   Ventilator    ASSESSMENT:   75 year old male admitted with angioedema presented to ED with complaints of possible allergic reaction, reported swelling of tongue. Past medical history significant for HTN, HLD, and recurrent prostate cancer.  1/29 - extubated 1/30 - diet advanced to full liquids  Per notes, sever angioedema suspected to be from ACE inhibitor use.  Pt advanced to full liquid diet yesterday with 1 meal completion of 50% documented. RD will order oral nutrition supplement to aid pt in meeting kcal and protein needs.  Admit weight: 127 kg Current weight: 131 kg  Per RN edema assessment, pt moderate pitting generalized edema and moderate pitting edema to BUE and BLE. Difficult to determine dry weight at this time.  Meal Completion: 50% x 1 documented meal  Medications reviewed and include: colace, pepcid, miralax, prednisone, IV abx  Labs reviewed: potassium 3.0 CBG's: 118-146 x 24 hours  UOP: 2600 ml x 24 hours I/O's: +4.3 L since admit  NUTRITION - FOCUSED PHYSICAL EXAM:  Unable to complete at this time. RD working remotely.  Diet Order:   Diet Order            Diet full liquid Room service appropriate? Yes; Fluid consistency: Thin  Diet effective now                 EDUCATION NEEDS:   No education needs have been  identified at this time  Skin:  Skin Assessment: Skin Integrity Issues: Stage II: Left buttocks  Last BM:  05/13/20 large type 4  Height:   Ht Readings from Last 1 Encounters:  05/10/20 6\' 2"  (1.88 m)    Weight:   Wt Readings from Last 1 Encounters:  05/12/20 131 kg    BMI:  Body mass index is 37.08 kg/m.  Estimated Nutritional Needs:   Kcal:  2200-2400  Protein:  115-130 grams  Fluid:  2.0 L/day    Gustavus Bryant, MS, RD, LDN Inpatient Clinical Dietitian Please see AMiON for contact information.

## 2020-05-14 NOTE — Plan of Care (Signed)
  Problem: Clinical Measurements: Goal: Respiratory complications will improve Outcome: Progressing   Problem: Clinical Measurements: Goal: Cardiovascular complication will be avoided Outcome: Progressing   Problem: Coping: Goal: Level of anxiety will decrease Outcome: Progressing   Problem: Pain Managment: Goal: General experience of comfort will improve Outcome: Progressing   

## 2020-05-14 NOTE — Evaluation (Signed)
Physical Therapy Evaluation Patient Details Name: Bernard Roy MRN: [id number] DOB: [birth year] Today's Date: 05/14/2020   History of Present Illness  75 yo admitted 1/26 with angioedema of the tongue presumably from ACE inhibitor, Intubated 1/26-1/29. PMHx: HTN, HLD, prostate CA, glaucoma, chronic back pain  Clinical Impression  Pt pleasant with delayed response to questions and commands. Pt reporting he lives alone and was independent PTA. Contacted sister who reports pt does live alone but after a few falls he has been in bed and bedbound for several months with family and friends intermittent assist. Pt with significant decline in function of all extremities, decreased transfers, mobility, cognition and safety who will benefit from acute therapy to maximize mobility, safety and function to decrease burden of care.      Follow Up Recommendations SNF;Supervision/Assistance - 24 hour    Equipment Recommendations  Hospital bed;Other (comment) (hoyer)    Recommendations for Other Services OT consult     Precautions / Restrictions        Mobility  Bed Mobility Overal bed mobility: Needs Assistance Bed Mobility: Supine to Sit;Sit to Supine     Supine to sit: HOB elevated     General bed mobility comments: HOB 25 degrees with max assist to shift legs toward EOB and attempt to roll, pt able to partially roll to right with max assist and unable to lift trunk from surface with 1 person assist. Total assist to return to supine and total assist to slide toward [facility]    Transfers                 General transfer comment: unable  Ambulation/Gait                Stairs            Wheelchair Mobility    Modified Rankin (Stroke Patients Only)       Balance Overall balance assessment: Needs assistance   Sitting balance-Leahy Scale: Zero Sitting balance - Comments: unable to achieve sitting                                     Pertinent  Vitals/Pain Pain Assessment: Faces Pain Score: 4  Pain Location: bil hips Pain Descriptors / Indicators: Aching;Guarding Pain Intervention(s): Limited activity within patient's tolerance;Repositioned    Home Living Family/patient expects to be discharged to:: Private residence Living Arrangements: Alone Available Help at Discharge: Friend(s);Family;Available PRN/intermittently Type of Home: House Home Access: Ramped entrance     Home Layout: One level Home Equipment: Rice - 4 wheels;Other (comment) (lift chair)      Prior Function Level of Independence: Needs assistance   Gait / Transfers Assistance Needed: per sister has not been out of bed for several months after 2 falls at home  ADL's / Homemaking Assistance Needed: niece stops by to bathe and clean him up periodically. Pt will call and ask family to bring him meals. Sister sets out his medication for the week  Comments: [name] sister providing PLOF and home setup as pt stating he walks and was independent PTA     Hand Dominance        Extremity/Trunk Assessment   Upper Extremity Assessment Upper Extremity Assessment: RUE deficits/detail;LUE deficits/detail RUE Deficits / Details: pt with limited shoulder flexion grossly 50 degrees, limited elbow flexion and unable to reach across body to roll LUE Deficits / Details: pt with limited shoulder flexion  grossly 50 degrees, limited elbow flexion and unable to reach across body to roll    Lower Extremity Assessment Lower Extremity Assessment: RLE deficits/detail;LLE deficits/detail RLE Deficits / Details: hip stuck in external rotation, limited knee flexion grossly 30 degrees, no active movement noted with hip abduction/ADD LLE Deficits / Details: grossly 45 degrees hip flexion PROM with pt denying further flexion, knee flexion grossly 30 degrees with assist for SAQ, AAROM for hip ABduct/ADD grossly 20 degrees    Cervical / Trunk Assessment Cervical / Trunk Assessment:  Kyphotic  Communication   Communication: No difficulties  Cognition Arousal/Alertness: Awake/alert Behavior During Therapy: Flat affect Overall Cognitive Status: Impaired/Different from baseline Area of Impairment: Memory;Orientation;Following commands;Safety/judgement;Awareness;Problem solving                 Orientation Level: Disoriented to;Time   Memory: Decreased short-term memory Following Commands: Follows one step commands inconsistently;Follows one step commands with increased time Safety/Judgement: Decreased awareness of safety;Decreased awareness of deficits   Problem Solving: Slow processing General Comments: pt with false report of PLOF and clarified by sister. Pt not oriented to day. Pt with severe debility and unaware of lack of function      General Comments      Exercises General Exercises - Lower Extremity Short Arc Quad: AAROM;Both;Seated;10 reps Hip ABduction/ADduction: AAROM;Both;10 reps;Seated   Assessment/Plan    PT Assessment All further PT needs can be met in the next venue of care  PT Problem List Decreased strength;Decreased mobility;Decreased activity tolerance;Decreased balance;Decreased range of motion;Obesity;Decreased cognition;Decreased knowledge of use of DME       PT Treatment Interventions      PT Goals (Current goals can be found in the Care Plan section)  Acute Rehab PT Goals Patient Stated Goal: be more mobile PT Goal Formulation: With patient/family Time For Goal Achievement: 05/28/20 Potential to Achieve Goals: Fair    Frequency     Barriers to discharge        Co-evaluation               AM-PAC PT "6 Clicks" Mobility  Outcome Measure Help needed turning from your back to your side while in a flat bed without using bedrails?: Total Help needed moving from lying on your back to sitting on the side of a flat bed without using bedrails?: Total Help needed moving to and from a bed to a chair (including a  wheelchair)?: Total Help needed standing up from a chair using your arms (e.g., wheelchair or bedside chair)?: Total Help needed to walk in hospital room?: Total Help needed climbing 3-5 steps with a railing? : Total 6 Click Score: 6    End of Session   Activity Tolerance: Patient tolerated treatment well Patient left: in bed;with call bell/phone within reach;with bed alarm set   PT Visit Diagnosis: Other abnormalities of gait and mobility (R26.89);Difficulty in walking, not elsewhere classified (R26.2);Muscle weakness (generalized) (M62.81)    Time: 9292-4462 PT Time Calculation (min) (ACUTE ONLY): 21 min   Charges:   PT Evaluation $PT Eval Moderate Complexity: 1 Mod          Alina Gilkey P, PT Acute Rehabilitation Services Pager: [phone] Office: [phone]   Sandy Salaam Reginal Wojcicki 05/14/2020, 9:26 AM

## 2020-05-14 NOTE — Progress Notes (Signed)
HOSPITAL MEDICINE OVERNIGHT EVENT NOTE    Notified by nursing earlier in the evening the patient was exhibiting a significant amount of purulence around the Foley catheter insertion site/urinary meatus.  Urinalysis obtained that is positive for both leukocyte esterase and nitrites suggestive of urinary tract infection.  Chart reviewed, note the patient has a substantial leukocytosis although this has been thought to be due to steroids previously.  Foley catheter has already been removed at the direction of the CCM attending which I agree with.  Considering the significant purulence coming from the urinary meatus we will add on a urine culture and initiate empiric intravenous ceftriaxone for now.  Be de-escalating or discontinued at the discretion of the daytime provider.  Vernelle Emerald  MD [facility]

## 2020-05-14 NOTE — Consult Note (Signed)
I have been asked to see the patient by Dr. Shelly Coss, for evaluation and management of scrotal swelling and purulent urethral drainage.  History of present illness:74 man admitted with severe angioedema suspected to be from ACE inhibitor use.  He was intubated upon arrival in ED and extubated on 05/12/2020.  Hospital course remarkable for anasarca/volume overload, purulent discharge from the urethral meatus, scrotal swelling.    Urology was consulted for scrotal swelling and purulent drainage per urethra.  Patient's bladder currently managed with condom catheter.  Patient denies having seen a urologist in the past.  He denies any lower urinary tract symptoms.    Review of systems: A 12 point comprehensive review of systems was obtained and is negative unless otherwise stated in the history of present illness.  Patient Active Problem List   Diagnosis Date Noted  . Pressure injury of skin 05/11/2020  . Airway obstruction   . Acute respiratory failure with hypoxemia ([place]) 05/10/2020  . Angioedema 05/09/2020  . HTN (hypertension) 05/09/2020  . History of colonic polyps 07/21/2019  . Elevated LFTs 07/21/2019    No current facility-administered medications on file prior to encounter.   Current Outpatient Medications on File Prior to Encounter  Medication Sig Dispense Refill  . acetaminophen (TYLENOL) 325 MG tablet Take 500 mg by mouth 2 (two) times daily as needed for mild pain or moderate pain.    Marland Kitchen amLODipine (NORVASC) 5 MG tablet Take 5 mg by mouth daily at 12 noon.   2  . atorvastatin (LIPITOR) 80 MG tablet Take 20 mg by mouth at bedtime.    Marland Kitchen HYDROcodone-acetaminophen (NORCO/VICODIN) 5-325 MG tablet Take 1 tablet by mouth daily as needed for moderate pain.     . hydrOXYzine (ATARAX/VISTARIL) 10 MG tablet Take 20 mg by mouth at bedtime.    Marland Kitchen lisinopril (ZESTRIL) 40 MG tablet Take 20 mg by mouth daily.    . melatonin 3 MG TABS tablet Take 3 mg by mouth at bedtime.    . tamsulosin  (FLOMAX) 0.4 MG CAPS capsule Take 0.4 mg by mouth in the morning and at bedtime.    Marland Kitchen zolpidem (AMBIEN) 10 MG tablet Take 10 mg by mouth at bedtime.     . Glycerin-Hypromellose-PEG 400 (DRY EYE RELIEF DROPS) 0.2-0.2-1 % SOLN Apply 1-2 drops to eye daily as needed (for dry eye relief).    . timolol (BETIMOL) 0.5 % ophthalmic solution Place 1 drop into both eyes 2 (two) times daily.      Past Medical History:  Diagnosis Date  . Chronic back pain   . Detached retina   . Glaucoma   . Hip pain   . HTN (hypertension)   . Hypercholesterolemia   . Memory loss    per sister. Not officially diagnosed with dementia. Able to remember short term and long term but sometimes mentions things that may not have happened and sister does not recognize  . Prostate cancer ([place])    recurrent. Radiation with first episode.     Past Surgical History:  Procedure Laterality Date  . COLONOSCOPY  2016   5 sessile polyps in ascending colon, 3 sessile polyps in transverse colon, 5 sessile polyps in sigmoid, and sigmoid diverticulosis. Path unknown  . TOTAL HIP ARTHROPLASTY     bilateral hips, X 2    Social History   Tobacco Use  . Smoking status: Former Research scientist (life sciences)  . Smokeless tobacco: Never Used  Substance Use Topics  . Alcohol use: Not Currently  Comment: in the past  . Drug use: Never    Family History  Problem Relation Age of Onset  . Colon cancer Neg Hx   . Liver disease Neg Hx     PE: Vitals:   05/14/20 0300 05/14/20 0400 05/14/20 0500 05/14/20 0600  BP:  (!) 146/63  (!) 149/65  Pulse: 92 94 94 94  Resp:      Temp:  99.2 F (37.3 C)    TempSrc:  Oral    SpO2: 94% 95% 94% 94%  Weight:      Height:       Patient appears to be in no acute distress  patient is alert and oriented x3 Atraumatic normocephalic head No cervical or supraclavicular lymphadenopathy appreciated No increased work of breathing, no audible wheezes/rhonchi Regular sinus rhythm/rate Abdomen is soft, nontender, no  suprapubic tenderness GU uncircumcised phallus with condom catheter in place, no purulent drainage visible, testes descended bilaterally with mild scrotal swelling consistent with volume overload Grossly neurologically intact No identifiable skin lesions  Recent Labs    05/12/20 0354 05/13/20 1018 05/14/20 0230  WBC 16.2* 16.2* 12.1*  HGB 11.4* 12.0* 11.0*  HCT 33.5* 36.2* 32.8*   Recent Labs    05/12/20 0354 05/13/20 1018 05/14/20 0230  NA 139 142 142  K 3.6 3.3* 3.3*  CL 107 110 110  CO2 21* 22 23  GLUCOSE 217* 133* 143*  BUN 29* 25* 19  CREATININE 1.22 0.97 0.91  CALCIUM 8.3* 8.5* 8.2*   No results for input(s): LABPT, INR in the last 72 hours. No results for input(s): LABURIN in the last 72 hours. Results for orders placed or performed during the hospital encounter of 05/09/20  SARS CORONAVIRUS 2 (TAT 6-24 HRS) Nasopharyngeal Nasopharyngeal Swab     Status: None   Collection Time: 05/09/20  5:16 PM   Specimen: Nasopharyngeal Swab  Result Value Ref Range Status   SARS Coronavirus 2 NEGATIVE NEGATIVE Final    Comment: (NOTE) SARS-CoV-2 target nucleic acids are NOT DETECTED.  The SARS-CoV-2 RNA is generally detectable in upper and lower respiratory specimens during the acute phase of infection. Negative results do not preclude SARS-CoV-2 infection, do not rule out co-infections with other pathogens, and should not be used as the sole basis for treatment or other patient management decisions. Negative results must be combined with clinical observations, patient history, and epidemiological information. The expected result is Negative.  Fact Sheet for Patients: SugarRoll.be  Fact Sheet for Healthcare Providers: https://www.woods-mathews.com/  This test is not yet approved or cleared by the [place] FDA and  has been authorized for detection and/or diagnosis of SARS-CoV-2 by FDA under an Emergency Use Authorization  (EUA). This EUA will remain  in effect (meaning this test can be used) for the duration of the COVID-19 declaration under Se ction 564(b)(1) of the Act, 21 U.S.C. section 360bbb-3(b)(1), unless the authorization is terminated or revoked sooner.  Performed at [facility], [address] [place], [place] [address]   MRSA PCR Screening     Status: None   Collection Time: 05/10/20  1:28 PM   Specimen: Nasal Mucosa; Nasopharyngeal  Result Value Ref Range Status   MRSA by PCR NEGATIVE NEGATIVE Final    Comment:        The GeneXpert MRSA Assay (FDA approved for NASAL specimens only), is one component of a comprehensive MRSA colonization surveillance program. It is not intended to diagnose MRSA infection nor to guide or monitor treatment for  MRSA infections. Performed at [facility], [address], [place], [place] [address]   Culture, blood (routine x 2)     Status: None (Preliminary result)   Collection Time: 05/11/20  1:15 PM   Specimen: BLOOD LEFT WRIST  Result Value Ref Range Status   Specimen Description BLOOD LEFT WRIST  Final   Special Requests   Final    BOTTLES DRAWN AEROBIC AND ANAEROBIC Blood Culture adequate volume   Culture   Final    NO GROWTH 3 DAYS Performed at [facility], [address] [place], [place] [address]    Report Status PENDING  Incomplete  Culture, blood (routine x 2)     Status: None (Preliminary result)   Collection Time: 05/11/20  1:17 PM   Specimen: BLOOD  Result Value Ref Range Status   Specimen Description BLOOD LEFT ANTECUBITAL  Final   Special Requests   Final    BOTTLES DRAWN AEROBIC AND ANAEROBIC Blood Culture adequate volume   Culture   Final    NO GROWTH 3 DAYS Performed at [facility], [address] [place], [place] [address]    Report Status PENDING  Incomplete      A/P: 75 year old man admitted for angioedema and currently being treated for volume overload with scrotal swelling.  1.  Scrotal swelling: Mild  scrotal edema consistent with volume overload on exam.  Recommend elevating scrotum with towel at all times while in bed.  He may wear jockstrap for scrotal support when out of bed.  This will resolve with time as fluid status improves.  No further urologic intervention.  2.  Purulent drainage per urethra: This was not seen on today's exam with condom catheter in place.  Urinalysis shows few bacteria however is nitrite and leukocyte positive.  Follow-up urine culture.  Please obtain post void residual volume to ensure that patient is emptying his bladder well.  If he has elevated PVRs would recommend placement of Foley catheter.   Thank you for involving me in this patient's care. Please page with any further questions or concerns. Vicki Pasqual D Juandaniel Manfredo

## 2020-05-14 NOTE — Progress Notes (Addendum)
PROGRESS NOTE    Bernard Roy  [id number] DOB: 02/25/1946 DOA: 05/09/2020 [facility]CP: [facility]   Chief Complain: Swelling of the tongue  Brief Narrative: Patient is a 47 male with history of hypertension, hyperlipidemia, prostate cancer who initially presented to the emergency department at [facility] hospital with complaint of swelling of the tongue.  Found to have severe angioedema suspected to be from ACE inhibitor use and was intubated in the emergency department and was transferred to [facility].  He was extubated on 05/12/2020.  Hospital course remarkable for anasarca/volume overload, purulent discharge from the urethral meatus, scrotal swelling.  Started on IV diuresis and urology consulted today.  Assessment & Plan:   Principal Problem:   Angioedema Active Problems:   HTN (hypertension)   Acute respiratory failure with hypoxemia (HCC)   Airway obstruction   Pressure injury of skin  Acute respiratory failure: Secondary to progressive angioedema.  Intubated for airway protection.  Currently successfully extubated.  Respiratory status is currently stable.  Angioedema: Suspected to be from lisinopril he was taking.  Continue prednisone, H1, H2 blockers.  Start on full liquid diet.   Volume overload: Patient looked extremely volume overloaded, BNP normal.  Stopped IV fluids.  Will start on Lasix 40 mg twice a day.  Will check echo.We will check albumin  Hypertension: Currently blood pressure stable.  On Norvasc and lisinopril at home.  We will permanently discontinue lisinopril for suspected angioedema.  Hyperlipidemia: On statin at home  Leukocytosis: Suspected  secondary to steroids/reactive. But there was a report of significant purulence around the Foley catheter/urinary meatus.  Also has scrotal swelling started on ceftriaxone. Follow-up urine culture. Blood cultures negative so far.  Urology consulted.  Morbid obesity: BMI of 37.08  Ambulatory  difficulty/deconditioning: PT/OT recommended skilled nursing facility on discharge.  Social worker consulted.  Nutrition Problem: Inadequate oral intake Etiology: inability to eat      DVT prophylaxis:lovenox Code Status: Full Family Communication: Called sister on phone on 05/14/20 Status is: Inpatient  Remains inpatient appropriate because:Inpatient level of care appropriate due to severity of illness   Dispo: The patient is from: Home              Anticipated d/c is to: Home              Anticipated d/c date is: 1 day              Patient currently is not medically stable to d/c.   Difficult to place patient No     Consultants: PCCM  Procedures:Intubation  Antimicrobials:  Anti-infectives (From admission, onward)   Start     Dose/Rate Route Frequency Ordered Stop   05/14/20 0115  cefTRIAXone (ROCEPHIN) 1 g in sodium chloride 0.9 % 100 mL IVPB        1 g 200 mL/hr over 30 Minutes Intravenous Every 24 hours 05/14/20 0017        Subjective:  Patient seen and examined the bedside this morning.  Hemodynamically stable.  Looks volume overloaded, has severe peripheral edema.  Also noted to have scrotal edema.  Discharge was noted from the urethral meatus.  Patient looks comfortable and denies any complaints.   Objective: Vitals:   05/14/20 0300 05/14/20 0400 05/14/20 0500 05/14/20 0600  BP:  (!) 146/63  (!) 149/65  Pulse: 92 94 94 94  Resp:      Temp:  99.2 F (37.3 C)    TempSrc:  Oral    SpO2: 94%  95% 94% 94%  Weight:      Height:        Intake/Output Summary (Last 24 hours) at 05/14/2020 0750 Last data filed at 05/14/2020 0600 Gross per 24 hour  Intake 1461.84 ml  Output 2600 ml  Net -1138.16 ml   Filed Weights   05/09/20 1647 05/10/20 1318 05/12/20 0400  Weight: 127 kg 134.4 kg 131 kg    Examination:  General exam: Very deconditioned, debilitated, morbidly obese Respiratory system: Bilateral equal air entry, normal vesicular breath sounds, no  wheezes or crackles  Cardiovascular system: S1 & S2 heard, RRR. No JVD, murmurs, rubs, gallops or clicks. Gastrointestinal system: Abdomen is nondistended, soft and nontender. No organomegaly or masses felt. Normal bowel sounds heard. Central nervous system: Alert and oriented. No focal neurological deficits. Extremities: Anasarca, no clubbing ,no cyanosis Skin: No rashes, lesions or ulcers,no icterus ,no pallor GU: Discharge from urethral meatus, scrotal edema  Data Reviewed: I have personally reviewed following labs and imaging studies  CBC: Recent Labs  Lab 05/10/20 0538 05/11/20 0448 05/12/20 0354 05/13/20 1018 05/14/20 0230  WBC 10.8* 13.1* 16.2* 16.2* 12.1*  NEUTROABS  --   --   --  13.7* 9.8*  HGB 11.6* 12.4* 11.4* 12.0* 11.0*  HCT 35.2* 37.4* 33.5* 36.2* 32.8*  MCV 92.1 91.4 90.5 92.3 92.7  PLT 240 243 241 266 [id number]   Basic Metabolic Panel: Recent Labs  Lab 05/10/20 0538 05/11/20 0448 05/12/20 0354 05/13/20 1018 05/14/20 0230  NA 134* 136 139 142 142  K 3.8 3.8 3.6 3.3* 3.3*  CL 102 105 107 110 110  CO2 23 22 21* 22 23  GLUCOSE 235* 226* 217* 133* 143*  BUN 18 20 29* 25* 19  CREATININE 0.86 0.83 1.22 0.97 0.91  CALCIUM 9.0 8.7* 8.3* 8.5* 8.2*  MG  --  1.8 1.9  --   --    GFR: Estimated Creatinine Clearance: 102.4 mL/min (by C-G formula based on SCr of 0.91 mg/dL). Liver Function Tests: No results for input(s): AST, ALT, ALKPHOS, BILITOT, PROT, ALBUMIN in the last 168 hours. No results for input(s): LIPASE, AMYLASE in the last 168 hours. No results for input(s): AMMONIA in the last 168 hours. Coagulation Profile: No results for input(s): INR, PROTIME in the last 168 hours. Cardiac Enzymes: No results for input(s): CKTOTAL, CKMB, CKMBINDEX, TROPONINI in the last 168 hours. BNP (last 3 results) No results for input(s): PROBNP in the last 8760 hours. HbA1C: No results for input(s): HGBA1C in the last 72 hours. CBG: Recent Labs  Lab 05/13/20 0820  05/13/20 1158 05/13/20 2022 05/14/20 0026 05/14/20 0357  GLUCAP 114* 118* 146* 139* 132*   Lipid Profile: Recent Labs    05/11/20 0820 05/14/20 0230  TRIG 143 153*   Thyroid Function Tests: No results for input(s): TSH, T4TOTAL, FREET4, T3FREE, THYROIDAB in the last 72 hours. Anemia Panel: No results for input(s): VITAMINB12, FOLATE, FERRITIN, TIBC, IRON, RETICCTPCT in the last 72 hours. Sepsis Labs: Recent Labs  Lab 05/11/20 0448  PROCALCITON <0.10    Recent Results (from the past 240 hour(s))  SARS CORONAVIRUS 2 (TAT 6-24 HRS) Nasopharyngeal Nasopharyngeal Swab     Status: None   Collection Time: 05/09/20  5:16 PM   Specimen: Nasopharyngeal Swab  Result Value Ref Range Status   SARS Coronavirus 2 NEGATIVE NEGATIVE Final    Comment: (NOTE) SARS-CoV-2 target nucleic acids are NOT DETECTED.  The SARS-CoV-2 RNA is generally detectable in upper and lower respiratory specimens during the acute  phase of infection. Negative results do not preclude SARS-CoV-2 infection, do not rule out co-infections with other pathogens, and should not be used as the sole basis for treatment or other patient management decisions. Negative results must be combined with clinical observations, patient history, and epidemiological information. The expected result is Negative.  Fact Sheet for Patients: SugarRoll.be  Fact Sheet for Healthcare Providers: https://www.woods-mathews.com/  This test is not yet approved or cleared by the [place] FDA and  has been authorized for detection and/or diagnosis of SARS-CoV-2 by FDA under an Emergency Use Authorization (EUA). This EUA will remain  in effect (meaning this test can be used) for the duration of the COVID-19 declaration under Se ction 564(b)(1) of the Act, 21 U.S.C. section 360bbb-3(b)(1), unless the authorization is terminated or revoked sooner.  Performed at [facility], [place]  [address] [place], [place] [address]   MRSA PCR Screening     Status: None   Collection Time: 05/10/20  1:28 PM   Specimen: Nasal Mucosa; Nasopharyngeal  Result Value Ref Range Status   MRSA by PCR NEGATIVE NEGATIVE Final    Comment:        The GeneXpert MRSA Assay (FDA approved for NASAL specimens only), is one component of a comprehensive MRSA colonization surveillance program. It is not intended to diagnose MRSA infection nor to guide or monitor treatment for MRSA infections. Performed at [facility], [address] [place], [place] [address]   Culture, blood (routine x 2)     Status: None (Preliminary result)   Collection Time: 05/11/20  1:15 PM   Specimen: BLOOD LEFT WRIST  Result Value Ref Range Status   Specimen Description BLOOD LEFT WRIST  Final   Special Requests   Final    BOTTLES DRAWN AEROBIC AND ANAEROBIC Blood Culture adequate volume   Culture   Final    NO GROWTH 3 DAYS Performed at [facility], [address] [place], [place] [address]    Report Status PENDING  Incomplete  Culture, blood (routine x 2)     Status: None (Preliminary result)   Collection Time: 05/11/20  1:17 PM   Specimen: BLOOD  Result Value Ref Range Status   Specimen Description BLOOD LEFT ANTECUBITAL  Final   Special Requests   Final    BOTTLES DRAWN AEROBIC AND ANAEROBIC Blood Culture adequate volume   Culture   Final    NO GROWTH 3 DAYS Performed at [facility], [address], [place],  [address]    Report Status PENDING  Incomplete         Radiology Studies: No results found.      Scheduled Meds: . chlorhexidine  15 mL Mouth Rinse BID  . Chlorhexidine Gluconate Cloth  6 each Topical Daily  . diphenhydrAMINE  25 mg Intravenous Q8H  . docusate  100 mg Per Tube BID  . enoxaparin (LOVENOX) injection  65 mg Subcutaneous QHS  . famotidine  20 mg Oral BID  . mouth rinse  15 mL Mouth Rinse q12n4p  . polyethylene glycol  17 g Per Tube Daily  . potassium chloride  40 mEq  Oral Once  . predniSONE  40 mg Per Tube Q breakfast  . sodium chloride flush  10-40 mL Intracatheter Q12H  . tamsulosin  0.4 mg Oral BID   Continuous Infusions: . cefTRIAXone (ROCEPHIN)  IV Stopped (05/14/20 0204)     LOS: 5 days    Time spent: 35 mins.More than 50% of that time was  spent in counseling and/or coordination of care.      Shelly Coss, MD [facility] P1/31/2022, 7:50 AM

## 2020-05-14 NOTE — Progress Notes (Signed)
  Echocardiogram 2D Echocardiogram has been performed.  

## 2020-05-15 DIAGNOSIS — T783XXS Angioneurotic edema, sequela: Secondary | ICD-10-CM | POA: Diagnosis not present

## 2020-05-15 LAB — GLUCOSE, CAPILLARY
Glucose-Capillary: 152 mg/dL — ABNORMAL HIGH (ref 70–99)
Glucose-Capillary: 222 mg/dL — ABNORMAL HIGH (ref 70–99)
Glucose-Capillary: 223 mg/dL — ABNORMAL HIGH (ref 70–99)
Glucose-Capillary: 232 mg/dL — ABNORMAL HIGH (ref 70–99)

## 2020-05-15 LAB — COMPREHENSIVE METABOLIC PANEL
ALT: 74 U/L — ABNORMAL HIGH (ref 0–44)
AST: 41 U/L (ref 15–41)
Albumin: 2.5 g/dL — ABNORMAL LOW (ref 3.5–5.0)
Alkaline Phosphatase: 101 U/L (ref 38–126)
Anion gap: 11 (ref 5–15)
BUN: 19 mg/dL (ref 8–23)
CO2: 26 mmol/L (ref 22–32)
Calcium: 8.8 mg/dL — ABNORMAL LOW (ref 8.9–10.3)
Chloride: 105 mmol/L (ref 98–111)
Creatinine, Ser: 0.94 mg/dL (ref 0.61–1.24)
GFR, Estimated: >60 mL/min (ref >60)
Glucose, Bld: 205 mg/dL — ABNORMAL HIGH (ref 70–99)
Potassium: 4 mmol/L (ref 3.5–5.1)
Sodium: 142 mmol/L (ref 135–145)
Total Bilirubin: 0.7 mg/dL (ref 0.3–1.2)
Total Protein: 5.9 g/dL — ABNORMAL LOW (ref 6.5–8.1)

## 2020-05-15 LAB — CBC WITH DIFFERENTIAL/PLATELET
Abs Immature Granulocytes: 0.57 10*3/uL — ABNORMAL HIGH (ref 0.00–0.07)
Basophils Absolute: 0.1 10*3/uL (ref 0.0–0.1)
Basophils Relative: 1 %
Eosinophils Absolute: 0 10*3/uL (ref 0.0–0.5)
Eosinophils Relative: 0 %
HCT: 36.4 % — ABNORMAL LOW (ref 39.0–52.0)
Hemoglobin: 11.8 g/dL — ABNORMAL LOW (ref 13.0–17.0)
Immature Granulocytes: 4 %
Lymphocytes Relative: 5 %
Lymphs Abs: 0.8 10*3/uL (ref 0.7–4.0)
MCH: 29.9 pg (ref 26.0–34.0)
MCHC: 32.4 g/dL (ref 30.0–36.0)
MCV: 92.4 fL (ref 80.0–100.0)
Monocytes Absolute: 1.1 10*3/uL — ABNORMAL HIGH (ref 0.1–1.0)
Monocytes Relative: 8 %
Neutro Abs: 11.9 10*3/uL — ABNORMAL HIGH (ref 1.7–7.7)
Neutrophils Relative %: 82 %
Platelets: 263 10*3/uL (ref 150–400)
RBC: 3.94 MIL/uL — ABNORMAL LOW (ref 4.22–5.81)
RDW: 13.5 % (ref 11.5–15.5)
WBC: 14.4 10*3/uL — ABNORMAL HIGH (ref 4.0–10.5)
nRBC: 0.1 % (ref 0.0–0.2)

## 2020-05-15 MED ORDER — FUROSEMIDE 40 MG PO TABS
40.0000 mg | ORAL_TABLET | Freq: Every day | ORAL | Status: DC
  Administered 2020-05-16 – 2020-05-18 (×3): 40 mg via ORAL
  Filled 2020-05-15 (×3): qty 1

## 2020-05-15 MED ORDER — COVID-19 MRNA VACC (MODERNA) 50 MCG/0.25ML IM SUSP
0.2500 mL | Freq: Once | INTRAMUSCULAR | Status: AC
  Administered 2020-05-15: 0.25 mL via INTRAMUSCULAR
  Filled 2020-05-15: qty 0.25

## 2020-05-15 NOTE — Plan of Care (Signed)

## 2020-05-15 NOTE — NC FL2 (Signed)
[place] LEVEL OF CARE SCREENING TOOL     IDENTIFICATION  Patient Name: Bernard Roy Birthdate: 15-Aug-1945 Sex: male Admission Date (Current Location): 05/09/2020  [facility] and [place] Number:  Herbalist and Address:  [facility], [place] [address] [place], [place] [address]      Provider Number: [id number]  Attending Physician Name and Address:  Shelly Coss, MD  Relative Name and Phone Number:  JAQUON, GINGERICH [phone]  [phone]    Current Level of Care: Hospital Recommended Level of Care: [place] Prior Approval Number:    Date Approved/Denied:   PASRR Number: [id number]  Discharge Plan: SNF    Current Diagnoses: Patient Active Problem List   Diagnosis Date Noted  . Pressure injury of skin 05/11/2020  . Airway obstruction   . Acute respiratory failure with hypoxemia ([place]) 05/10/2020  . Angioedema 05/09/2020  . HTN (hypertension) 05/09/2020  . History of colonic polyps 07/21/2019  . Elevated LFTs 07/21/2019    Orientation RESPIRATION BLADDER Height & Weight     Self,Time,Place  Normal Continent Weight: 288 lb 12.8 oz (131 kg) Height:  6\' 2"  (188 cm)  BEHAVIORAL SYMPTOMS/MOOD NEUROLOGICAL BOWEL NUTRITION STATUS      Continent Diet (Heart diet.  See discharge summary)  AMBULATORY STATUS COMMUNICATION OF NEEDS Skin   Total Care Verbally Skin abrasions                       Personal Care Assistance Level of Assistance  Bathing,Feeding,Dressing Bathing Assistance: Maximum assistance Feeding assistance: Independent Dressing Assistance: Maximum assistance     Functional Limitations Info  Sight,Hearing,Speech Sight Info: Adequate Hearing Info: Adequate Speech Info: Adequate    SPECIAL CARE FACTORS FREQUENCY  PT (By licensed PT),OT (By licensed OT)     PT Frequency: 5x week OT Frequency: 5x week            Contractures Contractures Info: Not present     Additional Factors Info  Code Status,Allergies Code Status Info: full Allergies Info: lisinopril           Current Medications (05/15/2020):  This is the current hospital active medication list Current Facility-Administered Medications  Medication Dose Route Frequency Provider Last Rate Last Admin  . (feeding supplement) PROSource Plus liquid 30 mL  30 mL Oral BID BM Adhikari, Amrit, MD   30 mL at 05/15/20 1250  . acetaminophen (TYLENOL) tablet 650 mg  650 mg Oral Q6H PRN Emokpae, Ejiroghene E, MD   650 mg at 05/15/20 0914   Or  . acetaminophen (TYLENOL) suppository 650 mg  650 mg Rectal Q6H PRN Emokpae, Ejiroghene E, MD      . cefTRIAXone (ROCEPHIN) 1 g in sodium chloride 0.9 % 100 mL IVPB  1 g Intravenous Q24H Shalhoub, Sherryll Burger, MD 200 mL/hr at 05/15/20 0308 1 g at 05/15/20 0308  . chlorhexidine (PERIDEX) 0.12 % solution 15 mL  15 mL Mouth Rinse BID Jacky Kindle, MD   15 mL at 05/15/20 0916  . Chlorhexidine Gluconate Cloth 2 % PADS 6 each  6 each Topical Daily Manuella Ghazi, Pratik D, DO   6 each at 05/15/20 0930  . COVID-19 mRNA vaccine (Moderna) injection 0.25 mL  0.25 mL Intramuscular Once Shelly Coss, MD      . diphenhydrAMINE (BENADRYL) capsule 25 mg  25 mg Oral Q6H PRN Adhikari, Amrit, MD      . docusate (COLACE) 50 MG/5ML liquid 100 mg  100  mg Per Tube BID Emokpae, Ejiroghene E, MD   100 mg at 05/15/20 0912  . enoxaparin (LOVENOX) injection 65 mg  65 mg Subcutaneous QHS Shah, Pratik D, DO   65 mg at 05/14/20 2143  . famotidine (PEPCID) tablet 20 mg  20 mg Oral BID Shelly Coss, MD   20 mg at 05/15/20 0914  . feeding supplement (ENSURE ENLIVE / ENSURE PLUS) liquid 237 mL  237 mL Oral TID BM Adhikari, Amrit, MD   237 mL at 05/15/20 1250  . fentaNYL (SUBLIMAZE) injection 50-100 mcg  50-100 mcg Intravenous Q1H PRN Tyna Jaksch, MD      . Derrill Memo ON 05/16/2020] furosemide (LASIX) tablet 40 mg  40 mg Oral Daily Adhikari, Amrit, MD      . insulin aspart (novoLOG) injection 0-20  Units  0-20 Units Subcutaneous TID WC Shelly Coss, MD   7 Units at 05/15/20 1251  . insulin aspart (novoLOG) injection 0-5 Units  0-5 Units Subcutaneous QHS Adhikari, Amrit, MD      . labetalol (NORMODYNE) injection 10 mg  10 mg Intravenous Q2H PRN Emokpae, Ejiroghene E, MD   10 mg at 05/13/20 1829  . MEDLINE mouth rinse  15 mL Mouth Rinse q12n4p Jacky Kindle, MD   15 mL at 05/15/20 1252  . ondansetron (ZOFRAN) tablet 4 mg  4 mg Oral Q6H PRN Emokpae, Ejiroghene E, MD       Or  . ondansetron (ZOFRAN) injection 4 mg  4 mg Intravenous Q6H PRN Emokpae, Ejiroghene E, MD      . polyethylene glycol (MIRALAX / GLYCOLAX) packet 17 g  17 g Per Tube Daily Emokpae, Ejiroghene E, MD   17 g at 05/15/20 0911  . polyethylene glycol (MIRALAX / GLYCOLAX) packet 17 g  17 g Oral Daily PRN Emokpae, Ejiroghene E, MD      . sodium chloride flush (NS) 0.9 % injection 10-40 mL  10-40 mL Intracatheter Q12H Shah, Pratik D, DO   10 mL at 05/15/20 0916  . sodium chloride flush (NS) 0.9 % injection 10-40 mL  10-40 mL Intracatheter PRN Manuella Ghazi, Pratik D, DO      . tamsulosin (FLOMAX) capsule 0.4 mg  0.4 mg Oral BID Shelly Coss, MD   0.4 mg at 05/15/20 2703     Discharge Medications: Please see discharge summary for a list of discharge medications.  Relevant Imaging Results:  Relevant Lab Results:   Additional Information SSN [id number]  Joanne Chars, LCSW

## 2020-05-15 NOTE — TOC Initial Note (Addendum)
Transition of Care [facility]) - Initial/Assessment Note    Patient Details  Name: Bernard Roy MRN: [id number] Date of Birth: 10/06/1945  Transition of Care [facility]) CM/SW Contact:    Joanne Chars, LCSW Phone Number: 05/15/2020, 11:34 AM  Clinical Narrative:   CSW met with pt to discuss discharge plan.  Pt agreeable to SNF recommendation, discussed need for VA approval. Permission given to speak to sister Bernard Roy.  Pt is vaccinated for covid, but not boosted, pt does request booster.  Pt reports he goes to [facility], PCP Dr Allyn Kenner.  Current equipment in home: walker, reacher, shower chair.  MD approves covid booster.                  Expected Discharge Plan: Skilled Nursing Facility Barriers to Discharge: SNF Pending bed offer   Patient Goals and CMS Choice Patient states their goals for this hospitalization and ongoing recovery are:: "get back to where I was, independent" CMS Medicare.gov Compare Post Acute Care list provided to::  (VA patient.  Waiting on options from [place])    Expected Discharge Plan and Services Expected Discharge Plan: [place] In-house Referral: Clinical Social Work   Post Acute Care Choice: [place] Living arrangements for the past 2 months: Apartment                                      Prior Living Arrangements/Services Living arrangements for the past 2 months: Apartment Lives with:: Self Patient language and need for interpreter reviewed:: Yes Do you feel safe going back to the place where you live?: No   currently unable to care for self  Need for Family Participation in Patient Care: Yes (Comment) Care giver support system in place?: Yes (comment) Current home services: Other (comment) (none) Criminal Activity/Legal Involvement Pertinent to Current Situation/Hospitalization: No - Comment as needed  Activities of Daily Living      Permission Sought/Granted Permission sought to share  information with : Family Chief Financial Officer Permission granted to share information with : Yes, Verbal Permission Granted  Share Information with NAME: sister Bernard Roy  Permission granted to share info w AGENCY: SNF        Emotional Assessment Appearance:: Appears stated age Attitude/Demeanor/Rapport: Engaged Affect (typically observed): Appropriate,Pleasant Orientation: : Oriented to Self,Oriented to Place,Oriented to  Time Alcohol / Substance Use: Not Applicable Psych Involvement: No (comment)  Admission diagnosis:  Airway obstruction [J98.8] Angioedema [T78.3XXA] Encounter for imaging study to confirm orogastric (OG) tube placement [Z01.89] Angioedema, initial encounter [T78.3XXA] Endotracheally intubated [Z97.8] Patient Active Problem List   Diagnosis Date Noted  . Pressure injury of skin 05/11/2020  . Airway obstruction   . Acute respiratory failure with hypoxemia ([place]) 05/10/2020  . Angioedema 05/09/2020  . HTN (hypertension) 05/09/2020  . History of colonic polyps 07/21/2019  . Elevated LFTs 07/21/2019   PCP:  [facility], [place]:   [place], [place] - [place] [address] [place] [address] [place] [address] Phone: [phone] Fax: [place], [place] [address] Phone: [phone] Fax: [phone]     Social Determinants of Health (SDOH) Interventions    Readmission Risk Interventions No flowsheet data found.

## 2020-05-15 NOTE — Plan of Care (Signed)
?  Problem: Clinical Measurements: ?Goal: Respiratory complications will improve ?Outcome: Progressing ?  ?Problem: Clinical Measurements: ?Goal: Cardiovascular complication will be avoided ?Outcome: Progressing ?  ?Problem: Nutrition: ?Goal: Adequate nutrition will be maintained ?Outcome: Progressing ?  ?Problem: Coping: ?Goal: Level of anxiety will decrease ?Outcome: Progressing ?  ?

## 2020-05-15 NOTE — Consult Note (Signed)
[place] Nurse Consult Note: Patient receiving care in [facility] 2W05 Reason for Consult: Left thigh and butt wounds Wound type: MASD/IAD Left gluteal fold measures 3 cm x 4 cm, yellow and crusted and Multiple kissing lesions in the intergluteal cleft that are red/pink and moist,  brown malodorous drainage on Telfa that was removed. Pressure Injury POA: NA Periwound: Intact Dressing procedure/placement/frequency: Clean the entire sacral area with soap and water, rinse and pat dry. Apply Aquacel Advantage Kellie Simmering # [phone]) to all wounds (place the Aquacel into the intergluteal cleft) and cover with foam dressing. Change daily or PRN soiling.  Order and place patient on air mattress. Use pressure redistribution chair pad when up in chair Kellie Simmering # [phone])  Monitor the wound area(s) for worsening of condition such as: Signs/symptoms of infection, increase in size, development of or worsening of odor, development of pain, or increased pain at the affected locations.   Notify the medical team if any of these develop.  Thank you for the consult. [place] nurse will not follow at this time.   Please re-consult the [place] team if needed.  Cathlean Marseilles Tamala Julian, MSN, RN, [place], Lysle Pearl, [facility] Wound Treatment Associate Pager [phone]

## 2020-05-15 NOTE — Progress Notes (Signed)
PROGRESS NOTE    Bernard Roy  [id number] DOB: 1945-10-09 DOA: 05/09/2020 PCP: [facility]   Chief Complain: Swelling of the tongue  Brief Narrative: Patient is a 75 male with history of hypertension, hyperlipidemia, prostate cancer who initially presented to the emergency department at [facility] hospital with complaint of swelling of the tongue.  Found to have severe angioedema suspected to be from ACE inhibitor use and was intubated in the emergency department and was transferred to [facility].  He was extubated on 05/12/2020.  Hospital course remarkable for anasarca/volume overload, purulent discharge from the urethral meatus, scrotal swelling.  Started on IV diuresis and urology consulted .  Significant improvement in the volume status now.  PT/OT recommended skilled facility.  He is medically stable for discharge to skilled nursing facility as soon as  bed is available.  Assessment & Plan:   Principal Problem:   Angioedema Active Problems:   HTN (hypertension)   Acute respiratory failure with hypoxemia (HCC)   Airway obstruction   Pressure injury of skin  Acute respiratory failure: Secondary to progressive angioedema.  Intubated for airway protection.  Currently successfully extubated.  Respiratory status is currently stable.  Angioedema: Suspected to be from lisinopril he was taking.  Treated prednisone, H1, H2 blockers. Tolerating diet   Volume overload: Patient looked extremely volume overloaded, BNP normal.  Could be secondary to fluids given during the hospitalization but also could be contributed from hypoalbuminemia and limited mobility.  Stopped IV fluids.  Started on Lasix 40 mg twice a day.  Significant diuresis happened with marked improvement in the volume overload.  Patient also had scrotal swelling,much improved now.  Urology recommended jockstrap for scrotal support when out of bed.  Acute exacerbation of diastolic congestive heart failure: Echo showed  ejection fraction 60 to [id number], grade 1 diastolic dysfunction.  Continue Lasix for now.  On discharge, he will be put on oral Lasix.  Hypertension: Currently blood pressure stable.  On Norvasc and lisinopril at home.  We will permanently discontinue lisinopril for suspected angioedema.  Hyperlipidemia: On statin at home  Leukocytosis/suspected UTI: Suspected  secondary to steroids/reactive. But there was a report of significant purulence around the Foley catheter/urinary meatus. Started on ceftriaxone. Follow-up urine culture. Blood cultures negative so far.    Urinary retention: Foley has been removed.  Will check postvoid residual volume.  If it shows significant post void residual volume, will replace Foley catheter.  On tamsulosin  Morbid obesity: BMI of 37.08  Ambulatory difficulty/deconditioning: PT/OT recommended skilled nursing facility on discharge.  Social worker consulted.  Nutrition Problem: Inadequate oral intake Etiology: inability to eat      DVT prophylaxis:lovenox Code Status: Full Family Communication: Called sister on phone on 05/14/20 Status is: Inpatient  Remains inpatient appropriate because:Inpatient level of care appropriate due to severity of illness   Dispo: The patient is from: Home              Anticipated d/c is to: SNF              Anticipated d/c date is: as soon as bed is available              Patient currently is medically stable for dc   Difficult to place patient No     Consultants: PCCM  Procedures:Intubation  Antimicrobials:  Anti-infectives (From admission, onward)   Start     Dose/Rate Route Frequency Ordered Stop   05/14/20 0115  cefTRIAXone (ROCEPHIN) 1 g in  sodium chloride 0.9 % 100 mL IVPB        1 g 200 mL/hr over 30 Minutes Intravenous Every 24 hours 05/14/20 0017        Subjective:  Patient seen and examined at the bedside this morning.  Hemodynamically stable.  Volume overload has significantly improved.  Lower extremity  edema has almost gone.  Scrotal edema has subsidized.Comfortable  Objective: Vitals:   05/14/20 1600 05/14/20 1700 05/14/20 1800 05/14/20 2043  BP:   (!) 163/74 (!) 149/72  Pulse: 90 86 91 88  Resp: (!) 21 (!) 24 (!) 21   Temp: 98.6 F (37 C)   98.8 F (37.1 C)  TempSrc: Axillary     SpO2: 98% 96% 99% 100%  Weight:      Height:        Intake/Output Summary (Last 24 hours) at 05/15/2020 0749 Last data filed at 05/15/2020 0601 Gross per 24 hour  Intake 240 ml  Output 4720 ml  Net -4480 ml   Filed Weights   05/09/20 1647 05/10/20 1318 05/12/20 0400  Weight: 127 kg 134.4 kg 131 kg    Examination:  General exam: Appears calm and comfortable ,Not in distress,morbidly obese HEENT:PERRL,Oral mucosa moist, Ear/Nose normal on gross exam Respiratory system: Bilateral equal air entry, normal vesicular breath sounds, no wheezes or crackles  Cardiovascular system: S1 & S2 heard, RRR. No JVD, murmurs, rubs, gallops or clicks. Gastrointestinal system: Abdomen is nondistended, soft and nontender. No organomegaly or masses felt. Normal bowel sounds heard. Central nervous system: Alert and oriented. No focal neurological deficits. Extremities: trace lower extremity edema, no clubbing ,no cyanosis Skin: No rashes, lesions or ulcers,no icterus ,no pallor   Data Reviewed: I have personally reviewed following labs and imaging studies  CBC: Recent Labs  Lab 05/11/20 0448 05/12/20 0354 05/13/20 1018 05/14/20 0230 05/15/20 0216  WBC 13.1* 16.2* 16.2* 12.1* 14.4*  NEUTROABS  --   --  13.7* 9.8* 11.9*  HGB 12.4* 11.4* 12.0* 11.0* 11.8*  HCT 37.4* 33.5* 36.2* 32.8* 36.4*  MCV 91.4 90.5 92.3 92.7 92.4  PLT 243 241 266 226 [id number]   Basic Metabolic Panel: Recent Labs  Lab 05/11/20 0448 05/12/20 0354 05/13/20 1018 05/14/20 0230 05/15/20 0216  NA 136 139 142 142 142  K 3.8 3.6 3.3* 3.3* 4.0  CL 105 107 110 110 105  CO2 22 21* 22 23 26   GLUCOSE 226* 217* 133* 143* 205*  BUN 20 29* 25* 19  19  CREATININE 0.83 1.22 0.97 0.91 0.94  CALCIUM 8.7* 8.3* 8.5* 8.2* 8.8*  MG 1.8 1.9  --   --   --    GFR: Estimated Creatinine Clearance: 99.2 mL/min (by C-G formula based on SCr of 0.94 mg/dL). Liver Function Tests: Recent Labs  Lab 05/15/20 0216  AST 41  ALT 74*  ALKPHOS 101  BILITOT 0.7  PROT 5.9*  ALBUMIN 2.5*   No results for input(s): LIPASE, AMYLASE in the last 168 hours. No results for input(s): AMMONIA in the last 168 hours. Coagulation Profile: No results for input(s): INR, PROTIME in the last 168 hours. Cardiac Enzymes: No results for input(s): CKTOTAL, CKMB, CKMBINDEX, TROPONINI in the last 168 hours. BNP (last 3 results) No results for input(s): PROBNP in the last 8760 hours. HbA1C: Recent Labs    05/14/20 1801  HGBA1C 7.1*   CBG: Recent Labs  Lab 05/14/20 0026 05/14/20 0357 05/14/20 0904 05/14/20 1707 05/14/20 2153  GLUCAP 139* 132* 123* 216* 190*   Lipid  Profile: Recent Labs    05/14/20 0230  TRIG 153*   Thyroid Function Tests: No results for input(s): TSH, T4TOTAL, FREET4, T3FREE, THYROIDAB in the last 72 hours. Anemia Panel: No results for input(s): VITAMINB12, FOLATE, FERRITIN, TIBC, IRON, RETICCTPCT in the last 72 hours. Sepsis Labs: Recent Labs  Lab 05/11/20 0448  PROCALCITON <0.10    Recent Results (from the past 240 hour(s))  SARS CORONAVIRUS 2 (TAT 6-24 HRS) Nasopharyngeal Nasopharyngeal Swab     Status: None   Collection Time: 05/09/20  5:16 PM   Specimen: Nasopharyngeal Swab  Result Value Ref Range Status   SARS Coronavirus 2 NEGATIVE NEGATIVE Final    Comment: (NOTE) SARS-CoV-2 target nucleic acids are NOT DETECTED.  The SARS-CoV-2 RNA is generally detectable in upper and lower respiratory specimens during the acute phase of infection. Negative results do not preclude SARS-CoV-2 infection, do not rule out co-infections with other pathogens, and should not be used as the sole basis for treatment or other patient  management decisions. Negative results must be combined with clinical observations, patient history, and epidemiological information. The expected result is Negative.  Fact Sheet for Patients: HairSlick.nohttps://www.fda.gov/media/138098/download  Fact Sheet for Healthcare Providers: quierodirigir.comhttps://www.fda.gov/media/138095/download  This test is not yet approved or cleared by the [place] FDA and  has been authorized for detection and/or diagnosis of SARS-CoV-2 by FDA under an Emergency Use Authorization (EUA). This EUA will remain  in effect (meaning this test can be used) for the duration of the COVID-19 declaration under Se ction 564(b)(1) of the Act, 21 U.S.C. section 360bbb-3(b)(1), unless the authorization is terminated or revoked sooner.  Performed at [facility], [address] [place], [place] [address]   MRSA PCR Screening     Status: None   Collection Time: 05/10/20  1:28 PM   Specimen: Nasal Mucosa; Nasopharyngeal  Result Value Ref Range Status   MRSA by PCR NEGATIVE NEGATIVE Final    Comment:        The GeneXpert MRSA Assay (FDA approved for NASAL specimens only), is one component of a comprehensive MRSA colonization surveillance program. It is not intended to diagnose MRSA infection nor to guide or monitor treatment for MRSA infections. Performed at [facility], [address] [place], [place] [address]   Culture, blood (routine x 2)     Status: None (Preliminary result)   Collection Time: 05/11/20  1:15 PM   Specimen: BLOOD LEFT WRIST  Result Value Ref Range Status   Specimen Description BLOOD LEFT WRIST  Final   Special Requests   Final    BOTTLES DRAWN AEROBIC AND ANAEROBIC Blood Culture adequate volume   Culture   Final    NO GROWTH 3 DAYS Performed at [facility], [address] [place], [place] [address]    Report Status PENDING  Incomplete  Culture, blood (routine x 2)     Status: None (Preliminary result)   Collection Time: 05/11/20  1:17 PM    Specimen: BLOOD  Result Value Ref Range Status   Specimen Description BLOOD LEFT ANTECUBITAL  Final   Special Requests   Final    BOTTLES DRAWN AEROBIC AND ANAEROBIC Blood Culture adequate volume   Culture   Final    NO GROWTH 3 DAYS Performed at [facility], [address] [place], [place] [address]    Report Status PENDING  Incomplete         Radiology Studies: ECHOCARDIOGRAM COMPLETE  Result Date: 05/14/2020    ECHOCARDIOGRAM REPORT  Patient Name:   ELAZAR ARGABRIGHT Date of Exam: 05/14/2020 Medical Rec #:  [id number]       Height:       74.0 in Accession #:    [id number]      Weight:       288.8 lb Date of Birth:  [birth year]      BSA:          2.541 m Patient Age:    80 years        BP:           156/99 mmHg Patient Gender: M               HR:           110 bpm. Exam Location:  Inpatient Procedure: 2D Echo, Cardiac Doppler, Color Doppler and Intracardiac            Opacification Agent Indications:    CHF  History:        Patient has no prior history of Echocardiogram examinations.                 Signs/Symptoms:resp. failure; Risk Factors:Hypertension,                 Dyslipidemia and Morbid obesity.  Sonographer:    Dustin Flock Referring Phys: [id number] Oluwateniola Leitch  Sonographer Comments: Technically difficult study due to poor echo windows and patient is morbidly obese. Image acquisition challenging due to patient body habitus. IMPRESSIONS  1. Left ventricular ejection fraction, by estimation, is 60 to 65%. The left ventricle has normal function. The left ventricle has no regional wall motion abnormalities. Left ventricular diastolic parameters are consistent with Grade I diastolic dysfunction (impaired relaxation).  2. Right ventricular systolic function is normal. The right ventricular size is normal. Tricuspid regurgitation signal is inadequate for assessing PA pressure.  3. Left atrial size was mildly dilated.  4. The mitral valve is normal in structure. No evidence of mitral valve  regurgitation. No evidence of mitral stenosis.  5. The aortic valve is tricuspid. Aortic valve regurgitation is not visualized. Mild aortic valve sclerosis is present, with no evidence of aortic valve stenosis.  6. Aortic dilatation noted. There is mild dilatation of the ascending aorta, measuring 38 mm.  7. The inferior vena cava is dilated in size with <50% respiratory variability, suggesting right atrial pressure of 15 mmHg.  8. There is a trivial pericardial effusion posterior to the left ventricle. FINDINGS  Left Ventricle: Left ventricular ejection fraction, by estimation, is 60 to 65%. The left ventricle has normal function. The left ventricle has no regional wall motion abnormalities. Definity contrast agent was given IV to delineate the left ventricular  endocardial borders. The left ventricular internal cavity size was normal in size. There is no left ventricular hypertrophy. Left ventricular diastolic parameters are consistent with Grade I diastolic dysfunction (impaired relaxation). Right Ventricle: The right ventricular size is normal. No increase in right ventricular wall thickness. Right ventricular systolic function is normal. Tricuspid regurgitation signal is inadequate for assessing PA pressure. Left Atrium: Left atrial size was mildly dilated. Right Atrium: Right atrial size was normal in size. Pericardium: Trivial pericardial effusion is present. The pericardial effusion is posterior to the left ventricle. Mitral Valve: The mitral valve is normal in structure. No evidence of mitral valve regurgitation. No evidence of mitral valve stenosis. Tricuspid Valve: The tricuspid valve is normal in structure. Tricuspid valve regurgitation is not demonstrated. No evidence of tricuspid stenosis. Aortic Valve:  The aortic valve is tricuspid. Aortic valve regurgitation is not visualized. Mild aortic valve sclerosis is present, with no evidence of aortic valve stenosis. Pulmonic Valve: The pulmonic valve was  normal in structure. Pulmonic valve regurgitation is not visualized. No evidence of pulmonic stenosis. Aorta: Aortic dilatation noted. There is mild dilatation of the ascending aorta, measuring 38 mm. Venous: The inferior vena cava is dilated in size with less than 50% respiratory variability, suggesting right atrial pressure of 15 mmHg. IAS/Shunts: No atrial level shunt detected by color flow Doppler.  LEFT VENTRICLE PLAX 2D LVIDd:         4.60 cm  Diastology LVIDs:         3.20 cm  LV e' medial:    9.17 cm/s LV PW:         1.20 cm  LV E/e' medial:  7.1 LV IVS:        1.20 cm  LV e' lateral:   7.93 cm/s LVOT diam:     2.90 cm  LV E/e' lateral: 8.3 LV SV:         127 LV SV Index:   50 LVOT Area:     6.61 cm  RIGHT VENTRICLE RV Basal diam:  3.80 cm RV S prime:     16.50 cm/s TAPSE (M-mode): 3.5 cm LEFT ATRIUM              Index       RIGHT ATRIUM           Index LA diam:        3.90 cm  1.53 cm/m  RA Area:     23.40 cm LA Vol (A2C):   76.4 ml  30.07 ml/m RA Volume:   80.00 ml  31.49 ml/m LA Vol (A4C):   101.0 ml 39.75 ml/m LA Biplane Vol: 92.9 ml  36.56 ml/m  AORTIC VALVE LVOT Vmax:   90.20 cm/s LVOT Vmean:  62.100 cm/s LVOT VTI:    0.192 m  AORTA Ao Root diam: 3.60 cm MITRAL VALVE MV Area (PHT): 6.96 cm     SHUNTS MV Decel Time: 109 msec     Systemic VTI:  0.19 m MV E velocity: 65.50 cm/s   Systemic Diam: 2.90 cm MV A velocity: 116.00 cm/s MV E/A ratio:  0.56 Fransico Him MD Electronically signed by Fransico Him MD Signature Date/Time: 05/14/2020/2:48:25 PM    Final         Scheduled Meds: . (feeding supplement) PROSource Plus  30 mL Oral BID BM  . chlorhexidine  15 mL Mouth Rinse BID  . Chlorhexidine Gluconate Cloth  6 each Topical Daily  . docusate  100 mg Per Tube BID  . enoxaparin (LOVENOX) injection  65 mg Subcutaneous QHS  . famotidine  20 mg Oral BID  . feeding supplement  237 mL Oral TID BM  . furosemide  40 mg Intravenous Q12H  . insulin aspart  0-20 Units Subcutaneous TID WC  .  insulin aspart  0-5 Units Subcutaneous QHS  . mouth rinse  15 mL Mouth Rinse q12n4p  . polyethylene glycol  17 g Per Tube Daily  . predniSONE  40 mg Per Tube Q breakfast  . sodium chloride flush  10-40 mL Intracatheter Q12H  . tamsulosin  0.4 mg Oral BID   Continuous Infusions: . cefTRIAXone (ROCEPHIN)  IV 1 g (05/15/20 0308)     LOS: 6 days    Time spent: 35 mins.More than 50% of that time was spent  in counseling and/or coordination of care.      Shelly Coss, MD [facility] P2/04/2020, 7:49 AM

## 2020-05-15 NOTE — Progress Notes (Addendum)
Bladder scan pt. For post void residual. 40ml in bladder.

## 2020-05-16 DIAGNOSIS — T783XXS Angioneurotic edema, sequela: Secondary | ICD-10-CM | POA: Diagnosis not present

## 2020-05-16 LAB — CULTURE, BLOOD (ROUTINE X 2)
Culture: NO GROWTH
Culture: NO GROWTH
Special Requests: ADEQUATE
Special Requests: ADEQUATE

## 2020-05-16 LAB — BASIC METABOLIC PANEL
Anion gap: 8 (ref 5–15)
BUN: 24 mg/dL — ABNORMAL HIGH (ref 8–23)
CO2: 30 mmol/L (ref 22–32)
Calcium: 8.8 mg/dL — ABNORMAL LOW (ref 8.9–10.3)
Chloride: 103 mmol/L (ref 98–111)
Creatinine, Ser: 0.9 mg/dL (ref 0.61–1.24)
GFR, Estimated: >60 mL/min (ref >60)
Glucose, Bld: 176 mg/dL — ABNORMAL HIGH (ref 70–99)
Potassium: 4 mmol/L (ref 3.5–5.1)
Sodium: 141 mmol/L (ref 135–145)

## 2020-05-16 LAB — CBC WITH DIFFERENTIAL/PLATELET
Abs Immature Granulocytes: 0.66 10*3/uL — ABNORMAL HIGH (ref 0.00–0.07)
Basophils Absolute: 0 10*3/uL (ref 0.0–0.1)
Basophils Relative: 0 %
Eosinophils Absolute: 0.1 10*3/uL (ref 0.0–0.5)
Eosinophils Relative: 1 %
HCT: 31.7 % — ABNORMAL LOW (ref 39.0–52.0)
Hemoglobin: 10.9 g/dL — ABNORMAL LOW (ref 13.0–17.0)
Immature Granulocytes: 5 %
Lymphocytes Relative: 6 %
Lymphs Abs: 0.8 10*3/uL (ref 0.7–4.0)
MCH: 31.4 pg (ref 26.0–34.0)
MCHC: 34.4 g/dL (ref 30.0–36.0)
MCV: 91.4 fL (ref 80.0–100.0)
Monocytes Absolute: 1.4 10*3/uL — ABNORMAL HIGH (ref 0.1–1.0)
Monocytes Relative: 10 %
Neutro Abs: 11.2 10*3/uL — ABNORMAL HIGH (ref 1.7–7.7)
Neutrophils Relative %: 78 %
Platelets: 219 10*3/uL (ref 150–400)
RBC: 3.47 MIL/uL — ABNORMAL LOW (ref 4.22–5.81)
RDW: 13.9 % (ref 11.5–15.5)
WBC: 14.2 10*3/uL — ABNORMAL HIGH (ref 4.0–10.5)
nRBC: 0.1 % (ref 0.0–0.2)

## 2020-05-16 LAB — GLUCOSE, CAPILLARY
Glucose-Capillary: 149 mg/dL — ABNORMAL HIGH (ref 70–99)
Glucose-Capillary: 163 mg/dL — ABNORMAL HIGH (ref 70–99)
Glucose-Capillary: 131 mg/dL — ABNORMAL HIGH (ref 70–99)
Glucose-Capillary: 229 mg/dL — ABNORMAL HIGH (ref 70–99)

## 2020-05-16 LAB — URINE CULTURE: Culture: >=100000 — AB

## 2020-05-16 LAB — SARS CORONAVIRUS 2 (TAT 6-24 HRS): SARS Coronavirus 2: NEGATIVE

## 2020-05-16 MED ORDER — CIPROFLOXACIN HCL 500 MG PO TABS
500.0000 mg | ORAL_TABLET | Freq: Two times a day (BID) | ORAL | Status: DC
  Administered 2020-05-17 – 2020-05-18 (×3): 500 mg via ORAL
  Filled 2020-05-16 (×4): qty 1

## 2020-05-16 NOTE — Progress Notes (Signed)
PROGRESS NOTE    Bernard Roy  [id number] DOB: 09-04-1945 DOA: 05/09/2020 PCP: [facility]   Chief Complain: Swelling of the tongue  Brief Narrative: Patient is a 75 male with history of hypertension, hyperlipidemia, prostate cancer who initially presented to the emergency department at [facility] hospital with complaint of swelling of the tongue.  Found to have severe angioedema suspected to be from ACE inhibitor use and was intubated in the emergency department and was transferred to [facility].  He was extubated on 05/12/2020.  Hospital course remarkable for anasarca/volume overload, purulent discharge from the urethral meatus, scrotal swelling.  Started on IV diuresis and urology consulted .  Significant improvement in the volume status now.  PT/OT recommended skilled facility.  He is medically stable for discharge to skilled nursing facility as soon as  bed is available.  Assessment & Plan:   Principal Problem:   Angioedema Active Problems:   HTN (hypertension)   Acute respiratory failure with hypoxemia (HCC)   Airway obstruction   Pressure injury of skin  Acute respiratory failure: Secondary to progressive angioedema.  Intubated for airway protection.  Currently successfully extubated.  Respiratory status is currently stable.  Angioedema: Suspected to be from lisinopril he was taking.  Treated prednisone, H1, H2 blockers. Tolerating diet   Volume overload: Patient looked extremely volume overloaded, BNP normal.  Could be secondary to fluids given during the hospitalization but also could be contributed from hypoalbuminemia and limited mobility.  Stopped IV fluids.  Started on Lasix 40 mg twice a day.  Significant diuresis happened with marked improvement in the volume overload.  Patient also had scrotal swelling,much improved now.  Urology recommended jockstrap for scrotal support when out of bed.  Acute exacerbation of diastolic congestive heart failure: Echo showed  ejection fraction 60 to [id number], grade 1 diastolic dysfunction.  Continue Lasix for now.  On discharge, he will be put on oral Lasix.  Hypertension: Currently blood pressure stable.  On Norvasc and lisinopril at home.  We will permanently discontinue lisinopril for suspected angioedema.  Hyperlipidemia: On statin at home  Leukocytosis/suspected UTI: Suspected  secondary to steroids/reactive. But there was a report of significant purulence around the Foley catheter/urinary meatus. Started on ceftriaxone.  Urine culture showed Enterobacter aerogens, will check antibiotics to Cipro .  Blood cultures negative so far.    Urinary retention: Foley has been removed.  Will check postvoid residual volume.  If it shows significant post void residual volume, will replace Foley catheter.  On tamsulosin  Morbid obesity: BMI of 37.08  Ambulatory difficulty/deconditioning: PT/OT recommended skilled nursing facility on discharge.  Social worker consulted.  Nutrition Problem: Inadequate oral intake Etiology: inability to eat      DVT prophylaxis:lovenox Code Status: Full Family Communication: Called sister on phone on 05/14/20 Status is: Inpatient  Remains inpatient appropriate because:Inpatient level of care appropriate due to severity of illness   Dispo: The patient is from: Home              Anticipated d/c is to: SNF              Anticipated d/c date is: as soon as bed is available              Patient currently is medically stable for dc   Difficult to place patient No     Consultants: PCCM  Procedures:Intubation  Antimicrobials:  Anti-infectives (From admission, onward)   Start     Dose/Rate Route Frequency Ordered Stop  05/14/20 0115  cefTRIAXone (ROCEPHIN) 1 g in sodium chloride 0.9 % 100 mL IVPB        1 g 200 mL/hr over 30 Minutes Intravenous Every 24 hours 05/14/20 0017        Subjective:  Patient seen and examined the bedside this morning.  Hemodynamically stable.  No new  complaints  Objective: Vitals:   05/14/20 2043 05/15/20 1235 05/15/20 2106 05/16/20 0534  BP: (!) 149/72 138/69 (!) 162/76 (!) 155/76  Pulse: 88 86 81 80  Resp:  20 20 20   Temp: 98.8 F (37.1 C) 98.6 F (37 C) 98.1 F (36.7 C) 98 F (36.7 C)  TempSrc:    Oral  SpO2: 100% 96% 96% 97%  Weight:      Height:        Intake/Output Summary (Last 24 hours) at 05/16/2020 1253 Last data filed at 05/16/2020 0105 Gross per 24 hour  Intake -  Output 900 ml  Net -900 ml   Filed Weights   05/09/20 1647 05/10/20 1318 05/12/20 0400  Weight: 127 kg 134.4 kg 131 kg    Examination:  General exam: Appears calm and comfortable ,Not in distress,morbidly obese HEENT:PERRL,Oral mucosa moist, Ear/Nose normal on gross exam Respiratory system: Bilateral equal air entry, normal vesicular breath sounds, no wheezes or crackles  Cardiovascular system: S1 & S2 heard, RRR. No JVD, murmurs, rubs, gallops or clicks. Gastrointestinal system: Abdomen is nondistended, soft and nontender. No organomegaly or masses felt. Normal bowel sounds heard. Central nervous system: Alert and oriented. No focal neurological deficits. Extremities: trace lower extremity edema, no clubbing ,no cyanosis Skin: No rashes, lesions or ulcers,no icterus ,no pallor   Data Reviewed: I have personally reviewed following labs and imaging studies  CBC: Recent Labs  Lab 05/12/20 0354 05/13/20 1018 05/14/20 0230 05/15/20 0216 05/16/20 0445  WBC 16.2* 16.2* 12.1* 14.4* 14.2*  NEUTROABS  --  13.7* 9.8* 11.9* 11.2*  HGB 11.4* 12.0* 11.0* 11.8* 10.9*  HCT 33.5* 36.2* 32.8* 36.4* 31.7*  MCV 90.5 92.3 92.7 92.4 91.4  PLT 241 266 226 263 967   Basic Metabolic Panel: Recent Labs  Lab 05/11/20 0448 05/12/20 0354 05/13/20 1018 05/14/20 0230 05/15/20 0216 05/16/20 0445  NA 136 139 142 142 142 141  K 3.8 3.6 3.3* 3.3* 4.0 4.0  CL 105 107 110 110 105 103  CO2 22 21* 22 23 26 30   GLUCOSE 226* 217* 133* 143* 205* 176*  BUN 20 29*  25* 19 19 24*  CREATININE 0.83 1.22 0.97 0.91 0.94 0.90  CALCIUM 8.7* 8.3* 8.5* 8.2* 8.8* 8.8*  MG 1.8 1.9  --   --   --   --    GFR: Estimated Creatinine Clearance: 103.6 mL/min (by C-G formula based on SCr of 0.9 mg/dL). Liver Function Tests: Recent Labs  Lab 05/15/20 0216  AST 41  ALT 74*  ALKPHOS 101  BILITOT 0.7  PROT 5.9*  ALBUMIN 2.5*   No results for input(s): LIPASE, AMYLASE in the last 168 hours. No results for input(s): AMMONIA in the last 168 hours. Coagulation Profile: No results for input(s): INR, PROTIME in the last 168 hours. Cardiac Enzymes: No results for input(s): CKTOTAL, CKMB, CKMBINDEX, TROPONINI in the last 168 hours. BNP (last 3 results) No results for input(s): PROBNP in the last 8760 hours. HbA1C: Recent Labs    05/14/20 1801  HGBA1C 7.1*   CBG: Recent Labs  Lab 05/15/20 1213 05/15/20 1730 05/15/20 2102 05/16/20 0814 05/16/20 1205  GLUCAP 222*  223* 232* 149* 163*   Lipid Profile: Recent Labs    05/14/20 0230  TRIG 153*   Thyroid Function Tests: No results for input(s): TSH, T4TOTAL, FREET4, T3FREE, THYROIDAB in the last 72 hours. Anemia Panel: No results for input(s): VITAMINB12, FOLATE, FERRITIN, TIBC, IRON, RETICCTPCT in the last 72 hours. Sepsis Labs: Recent Labs  Lab 05/11/20 0448  PROCALCITON <0.10    Recent Results (from the past 240 hour(s))  SARS CORONAVIRUS 2 (TAT 6-24 HRS) Nasopharyngeal Nasopharyngeal Swab     Status: None   Collection Time: 05/09/20  5:16 PM   Specimen: Nasopharyngeal Swab  Result Value Ref Range Status   SARS Coronavirus 2 NEGATIVE NEGATIVE Final    Comment: (NOTE) SARS-CoV-2 target nucleic acids are NOT DETECTED.  The SARS-CoV-2 RNA is generally detectable in upper and lower respiratory specimens during the acute phase of infection. Negative results do not preclude SARS-CoV-2 infection, do not rule out co-infections with other pathogens, and should not be used as the sole basis for  treatment or other patient management decisions. Negative results must be combined with clinical observations, patient history, and epidemiological information. The expected result is Negative.  Fact Sheet for Patients: SugarRoll.be  Fact Sheet for Healthcare Providers: https://www.woods-mathews.com/  This test is not yet approved or cleared by the [place] FDA and  has been authorized for detection and/or diagnosis of SARS-CoV-2 by FDA under an Emergency Use Authorization (EUA). This EUA will remain  in effect (meaning this test can be used) for the duration of the COVID-19 declaration under Se ction 564(b)(1) of the Act, 21 U.S.C. section 360bbb-3(b)(1), unless the authorization is terminated or revoked sooner.  Performed at [facility], [place] [address] [place], [place] [address]   MRSA PCR Screening     Status: None   Collection Time: 05/10/20  1:28 PM   Specimen: Nasal Mucosa; Nasopharyngeal  Result Value Ref Range Status   MRSA by PCR NEGATIVE NEGATIVE Final    Comment:        The GeneXpert MRSA Assay (FDA approved for NASAL specimens only), is one component of a comprehensive MRSA colonization surveillance program. It is not intended to diagnose MRSA infection nor to guide or monitor treatment for MRSA infections. Performed at [facility], [address] [place], [place] [address]   Culture, blood (routine x 2)     Status: None   Collection Time: 05/11/20  1:15 PM   Specimen: BLOOD LEFT WRIST  Result Value Ref Range Status   Specimen Description BLOOD LEFT WRIST  Final   Special Requests   Final    BOTTLES DRAWN AEROBIC AND ANAEROBIC Blood Culture adequate volume   Culture   Final    NO GROWTH 5 DAYS Performed at [facility], [address] [place], [place] [address]    Report Status 05/16/2020 FINAL  Final  Culture, blood (routine x 2)     Status: None   Collection Time: 05/11/20  1:17 PM   Specimen:  BLOOD  Result Value Ref Range Status   Specimen Description BLOOD LEFT ANTECUBITAL  Final   Special Requests   Final    BOTTLES DRAWN AEROBIC AND ANAEROBIC Blood Culture adequate volume   Culture   Final    NO GROWTH 5 DAYS Performed at [facility], [address], [place], [place] [address]    Report Status 05/16/2020 FINAL  Final  Culture, Urine     Status: Abnormal   Collection Time: 05/14/20  1:00 AM  Specimen: Urine, Random  Result Value Ref Range Status   Specimen Description URINE, RANDOM  Final   Special Requests   Final    NONE Performed at [facility], [address], [place], [place] [address]    Culture >=100,000 COLONIES/mL ENTEROBACTER AEROGENES (A)  Final   Report Status 05/16/2020 FINAL  Final   Organism ID, Bacteria ENTEROBACTER AEROGENES (A)  Final      Susceptibility   Enterobacter aerogenes - MIC*    CEFAZOLIN >=64 RESISTANT Resistant     CEFEPIME <=0.12 SENSITIVE Sensitive     CEFTRIAXONE <=0.25 SENSITIVE Sensitive     CIPROFLOXACIN <=0.25 SENSITIVE Sensitive     GENTAMICIN <=1 SENSITIVE Sensitive     IMIPENEM 2 SENSITIVE Sensitive     NITROFURANTOIN 64 INTERMEDIATE Intermediate     TRIMETH/SULFA <=20 SENSITIVE Sensitive     PIP/TAZO <=4 SENSITIVE Sensitive     * >=100,000 COLONIES/mL ENTEROBACTER AEROGENES         Radiology Studies: ECHOCARDIOGRAM COMPLETE  Result Date: 05/14/2020    ECHOCARDIOGRAM REPORT   Patient Name:   PTOLEMY SAVANNAH Date of Exam: 05/14/2020 Medical Rec #:  [id number]       Height:       74.0 in Accession #:    [id number]      Weight:       288.8 lb Date of Birth:  16-Jun-1945      BSA:          2.541 m Patient Age:    81 years        BP:           156/99 mmHg Patient Gender: M               HR:           110 bpm. Exam Location:  Inpatient Procedure: 2D Echo, Cardiac Doppler, Color Doppler and Intracardiac            Opacification Agent Indications:    CHF  History:        Patient has no prior history of Echocardiogram  examinations.                 Signs/Symptoms:resp. failure; Risk Factors:Hypertension,                 Dyslipidemia and Morbid obesity.  Sonographer:    Dustin Flock Referring Phys: [id number] Keyetta Hollingworth  Sonographer Comments: Technically difficult study due to poor echo windows and patient is morbidly obese. Image acquisition challenging due to patient body habitus. IMPRESSIONS  1. Left ventricular ejection fraction, by estimation, is 60 to 65%. The left ventricle has normal function. The left ventricle has no regional wall motion abnormalities. Left ventricular diastolic parameters are consistent with Grade I diastolic dysfunction (impaired relaxation).  2. Right ventricular systolic function is normal. The right ventricular size is normal. Tricuspid regurgitation signal is inadequate for assessing PA pressure.  3. Left atrial size was mildly dilated.  4. The mitral valve is normal in structure. No evidence of mitral valve regurgitation. No evidence of mitral stenosis.  5. The aortic valve is tricuspid. Aortic valve regurgitation is not visualized. Mild aortic valve sclerosis is present, with no evidence of aortic valve stenosis.  6. Aortic dilatation noted. There is mild dilatation of the ascending aorta, measuring 38 mm.  7. The inferior vena cava is dilated in size with <50% respiratory variability, suggesting right atrial pressure of 15 mmHg.  8. There is a  trivial pericardial effusion posterior to the left ventricle. FINDINGS  Left Ventricle: Left ventricular ejection fraction, by estimation, is 60 to 65%. The left ventricle has normal function. The left ventricle has no regional wall motion abnormalities. Definity contrast agent was given IV to delineate the left ventricular  endocardial borders. The left ventricular internal cavity size was normal in size. There is no left ventricular hypertrophy. Left ventricular diastolic parameters are consistent with Grade I diastolic dysfunction (impaired  relaxation). Right Ventricle: The right ventricular size is normal. No increase in right ventricular wall thickness. Right ventricular systolic function is normal. Tricuspid regurgitation signal is inadequate for assessing PA pressure. Left Atrium: Left atrial size was mildly dilated. Right Atrium: Right atrial size was normal in size. Pericardium: Trivial pericardial effusion is present. The pericardial effusion is posterior to the left ventricle. Mitral Valve: The mitral valve is normal in structure. No evidence of mitral valve regurgitation. No evidence of mitral valve stenosis. Tricuspid Valve: The tricuspid valve is normal in structure. Tricuspid valve regurgitation is not demonstrated. No evidence of tricuspid stenosis. Aortic Valve: The aortic valve is tricuspid. Aortic valve regurgitation is not visualized. Mild aortic valve sclerosis is present, with no evidence of aortic valve stenosis. Pulmonic Valve: The pulmonic valve was normal in structure. Pulmonic valve regurgitation is not visualized. No evidence of pulmonic stenosis. Aorta: Aortic dilatation noted. There is mild dilatation of the ascending aorta, measuring 38 mm. Venous: The inferior vena cava is dilated in size with less than 50% respiratory variability, suggesting right atrial pressure of 15 mmHg. IAS/Shunts: No atrial level shunt detected by color flow Doppler.  LEFT VENTRICLE PLAX 2D LVIDd:         4.60 cm  Diastology LVIDs:         3.20 cm  LV e' medial:    9.17 cm/s LV PW:         1.20 cm  LV E/e' medial:  7.1 LV IVS:        1.20 cm  LV e' lateral:   7.93 cm/s LVOT diam:     2.90 cm  LV E/e' lateral: 8.3 LV SV:         127 LV SV Index:   50 LVOT Area:     6.61 cm  RIGHT VENTRICLE RV Basal diam:  3.80 cm RV S prime:     16.50 cm/s TAPSE (M-mode): 3.5 cm LEFT ATRIUM              Index       RIGHT ATRIUM           Index LA diam:        3.90 cm  1.53 cm/m  RA Area:     23.40 cm LA Vol (A2C):   76.4 ml  30.07 ml/m RA Volume:   80.00 ml  31.49  ml/m LA Vol (A4C):   101.0 ml 39.75 ml/m LA Biplane Vol: 92.9 ml  36.56 ml/m  AORTIC VALVE LVOT Vmax:   90.20 cm/s LVOT Vmean:  62.100 cm/s LVOT VTI:    0.192 m  AORTA Ao Root diam: 3.60 cm MITRAL VALVE MV Area (PHT): 6.96 cm     SHUNTS MV Decel Time: 109 msec     Systemic VTI:  0.19 m MV E velocity: 65.50 cm/s   Systemic Diam: 2.90 cm MV A velocity: 116.00 cm/s MV E/A ratio:  0.56 Fransico Him MD Electronically signed by Fransico Him MD Signature Date/Time: 05/14/2020/2:48:25 PM    Final  Scheduled Meds: . (feeding supplement) PROSource Plus  30 mL Oral BID BM  . chlorhexidine  15 mL Mouth Rinse BID  . Chlorhexidine Gluconate Cloth  6 each Topical Daily  . docusate  100 mg Per Tube BID  . enoxaparin (LOVENOX) injection  65 mg Subcutaneous QHS  . famotidine  20 mg Oral BID  . feeding supplement  237 mL Oral TID BM  . furosemide  40 mg Oral Daily  . insulin aspart  0-20 Units Subcutaneous TID WC  . insulin aspart  0-5 Units Subcutaneous QHS  . mouth rinse  15 mL Mouth Rinse q12n4p  . polyethylene glycol  17 g Per Tube Daily  . sodium chloride flush  10-40 mL Intracatheter Q12H  . tamsulosin  0.4 mg Oral BID   Continuous Infusions: . cefTRIAXone (ROCEPHIN)  IV 1 g (05/16/20 0103)     LOS: 7 days    Time spent: 35 mins.More than 50% of that time was spent in counseling and/or coordination of care.      Shelly Coss, MD [facility] P2/05/2020, 12:53 PM

## 2020-05-16 NOTE — Plan of Care (Signed)
  Problem: Education: Goal: Knowledge of General Education information will improve Description Including pain rating scale, medication(s)/side effects and non-pharmacologic comfort measures Outcome: Progressing   

## 2020-05-16 NOTE — Progress Notes (Signed)
Physical Therapy Treatment Patient Details Name: Bernard Roy MRN: [id number] DOB: 1945-07-04 Today's Date: 05/16/2020    History of Present Illness 75 yo admitted 1/26 with angioedema of the tongue presumably from ACE inhibitor, Intubated 1/26-1/29. PMHx: HTN, HLD, prostate CA, glaucoma, chronic back pain    PT Comments    Pt continues to be very stiff with edema t/o body. Pt with limited active movement in LEs, pt able to reach above head with L UE and pull self up in bed with bed in trendelenburg and participate in LE exercises at EOB however pt unable to attain upright EOB posture at EOB due to onset of R hip pain, pt total assist to maintain EOB balance in very posteriorly reclined position. Pt remains appropriate for SNF upon d/c. Acute PT to cont to follow.    Follow Up Recommendations  SNF;Supervision/Assistance - 24 hour     Equipment Recommendations  Hospital bed;Other (comment) (hoyer)    Recommendations for Other Services OT consult     Precautions / Restrictions Precautions Precautions: Fall Restrictions Weight Bearing Restrictions: No    Mobility  Bed Mobility Overal bed mobility: Needs Assistance Bed Mobility: Supine to Sit;Sit to Supine     Supine to sit: HOB elevated;Total assist Sit to supine: Total assist   General bed mobility comments: pt initiated LE movement to EOB however overall required total assist for trunk and LE management  Transfers                 General transfer comment: attempted to use the stedy however pt unable to achieve hip flexion to 90 deg without R hip pain, pt unable to grab bar on steady due limited hip flexion, pt unable to obtain full upright position at EOB  Ambulation/Gait             General Gait Details: unable   Stairs             Wheelchair Mobility    Modified Rankin (Stroke Patients Only)       Balance Overall balance assessment: Needs assistance Sitting-balance support: Bilateral  upper extremity supported Sitting balance-Leahy Scale: Zero Sitting balance - Comments: pt with significant posterior lean due to inability to tolerate sitting due to hip pain                                    Cognition Arousal/Alertness: Awake/alert Behavior During Therapy: Flat affect Overall Cognitive Status: Impaired/Different from baseline Area of Impairment: Memory;Orientation;Following commands;Safety/judgement;Awareness;Problem solving                 Orientation Level: Disoriented to;Situation   Memory: Decreased short-term memory Following Commands: Follows one step commands with increased time Safety/Judgement: Decreased awareness of safety;Decreased awareness of deficits Awareness: Intellectual Problem Solving: Slow processing;Difficulty sequencing;Requires verbal cues;Requires tactile cues General Comments: pt with delayed response time, unsure of accuracy of PLOF stating he lived alone and was indep, pt did state the date with increased time      Exercises General Exercises - Lower Extremity Long Arc Quad: AROM;Both;10 reps;Seated Hip Flexion/Marching: AROM;Both;10 reps;Seated Heel Raises: AROM;Both;10 reps;Seated    General Comments General comments (skin integrity, edema, etc.): VSS      Pertinent Vitals/Pain Pain Assessment: Faces Faces Pain Scale: Hurts even more Pain Location: R hip with sitting up Pain Descriptors / Indicators: Grimacing;Sore Pain Intervention(s): Monitored during session    Home Living  Prior Function            PT Goals (current goals can now be found in the care plan section) Acute Rehab PT Goals PT Goal Formulation: With patient/family Time For Goal Achievement: 05/28/20 Potential to Achieve Goals: Fair Progress towards PT goals: Not progressing toward goals - comment    Frequency    Min 2X/week      PT Plan Current plan remains appropriate    Co-evaluation               AM-PAC PT "6 Clicks" Mobility   Outcome Measure  Help needed turning from your back to your side while in a flat bed without using bedrails?: Total Help needed moving from lying on your back to sitting on the side of a flat bed without using bedrails?: Total Help needed moving to and from a bed to a chair (including a wheelchair)?: Total Help needed standing up from a chair using your arms (e.g., wheelchair or bedside chair)?: Total Help needed to walk in hospital room?: Total Help needed climbing 3-5 steps with a railing? : Total 6 Click Score: 6    End of Session   Activity Tolerance: Patient limited by pain Patient left: in bed;with call bell/phone within reach;with bed alarm set (in chair position) Nurse Communication: Mobility status PT Visit Diagnosis: Other abnormalities of gait and mobility (R26.89);Difficulty in walking, not elsewhere classified (R26.2);Muscle weakness (generalized) (M62.81)     Time: 8675-4492 PT Time Calculation (min) (ACUTE ONLY): 21 min  Charges:  $Therapeutic Activity: 8-22 mins                     Kittie Plater, PT, DPT Acute Rehabilitation Services Pager #: [phone] Office #: [phone]    Berline Lopes 05/16/2020, 2:21 PM

## 2020-05-16 NOTE — Plan of Care (Signed)
  Problem: Clinical Measurements: Goal: Respiratory complications will improve Outcome: Progressing   Problem: Clinical Measurements: Goal: Cardiovascular complication will be avoided Outcome: Progressing   Problem: Nutrition: Goal: Adequate nutrition will be maintained Outcome: Progressing   Problem: Pain Managment: Goal: General experience of comfort will improve Outcome: Progressing   Problem: Safety: Goal: Ability to remain free from injury will improve Outcome: Progressing   

## 2020-05-16 NOTE — TOC Progression Note (Signed)
Transition of Care [facility]) - Progression Note    Patient Details  Name: LUISFERNANDO BRIGHTWELL MRN: [id number] Date of Birth: [birth year]  Transition of Care [facility]) CM/SW Contact  Joanne Chars, LCSW Phone Number: 05/16/2020, 4:14 PM  Clinical Narrative:   CSW spoke with pt in AM regarding bed offers.  Pt reports using his [place] insurance is important to him, not [employer].  Pt asked CSW to share information with sister [name].  CSW spoke with [name] and gave her bed offers and options for both [place] and [employer] facilities.  [name] asked for time to research the options.  1600:  CSW spoke with [name] again.  She has not been able to make a decision yet and asked if she could call back in the AM.  CSW walked her through Medicare.gov website and showed her the ratings on that site. Several VA options are one star, humana option in [place] also one star [facility])  Discussed may have option for higher rated facilities if they use the humana.    Expected Discharge Plan: Skilled Nursing Facility Barriers to Discharge: SNF Pending bed offer  Expected Discharge Plan and Services Expected Discharge Plan: [place] In-house Referral: Clinical Social Work   Post Acute Care Choice: [place] Living arrangements for the past 2 months: Apartment                                       Social Determinants of Health (SDOH) Interventions    Readmission Risk Interventions No flowsheet data found.

## 2020-05-16 NOTE — Progress Notes (Signed)
SLP Cancellation Note  Patient Details Name: Bernard Roy MRN: [id number] DOB: 05-05-1945   Cancelled evaluation: new orders received for swallow assessment; diet orders clarified with MD and repeat assessment not indicated.  SLP will sign off.Shonika Kolasinski L. Tivis Ringer, [place] Office number [phone] Pager [phone]        Assunta Curtis 05/16/2020, 9:19 AM

## 2020-05-17 ENCOUNTER — Encounter (HOSPITAL_COMMUNITY): Payer: Self-pay | Admitting: Internal Medicine

## 2020-05-17 DIAGNOSIS — T783XXS Angioneurotic edema, sequela: Secondary | ICD-10-CM | POA: Diagnosis not present

## 2020-05-17 LAB — CBC WITH DIFFERENTIAL/PLATELET
Abs Immature Granulocytes: 0.4 10*3/uL — ABNORMAL HIGH (ref 0.00–0.07)
Basophils Absolute: 0 10*3/uL (ref 0.0–0.1)
Basophils Relative: 0 %
Eosinophils Absolute: 0.4 10*3/uL (ref 0.0–0.5)
Eosinophils Relative: 4 %
HCT: 34.1 % — ABNORMAL LOW (ref 39.0–52.0)
Hemoglobin: 10.9 g/dL — ABNORMAL LOW (ref 13.0–17.0)
Lymphocytes Relative: 7 %
Lymphs Abs: 0.7 10*3/uL (ref 0.7–4.0)
MCH: 30.2 pg (ref 26.0–34.0)
MCHC: 32 g/dL (ref 30.0–36.0)
MCV: 94.5 fL (ref 80.0–100.0)
Monocytes Absolute: 1 10*3/uL (ref 0.1–1.0)
Monocytes Relative: 10 %
Myelocytes: 4 %
Neutro Abs: 7.4 10*3/uL (ref 1.7–7.7)
Neutrophils Relative %: 75 %
Platelets: 192 10*3/uL (ref 150–400)
RBC: 3.61 MIL/uL — ABNORMAL LOW (ref 4.22–5.81)
RDW: 14 % (ref 11.5–15.5)
WBC: 9.8 10*3/uL (ref 4.0–10.5)
nRBC: 0 % (ref 0.0–0.2)
nRBC: 1 /100 WBC — ABNORMAL HIGH

## 2020-05-17 LAB — BASIC METABOLIC PANEL
Anion gap: 9 (ref 5–15)
BUN: 28 mg/dL — ABNORMAL HIGH (ref 8–23)
CO2: 28 mmol/L (ref 22–32)
Calcium: 8.1 mg/dL — ABNORMAL LOW (ref 8.9–10.3)
Chloride: 103 mmol/L (ref 98–111)
Creatinine, Ser: 1.03 mg/dL (ref 0.61–1.24)
GFR, Estimated: >60 mL/min (ref >60)
Glucose, Bld: 140 mg/dL — ABNORMAL HIGH (ref 70–99)
Potassium: 3.8 mmol/L (ref 3.5–5.1)
Sodium: 140 mmol/L (ref 135–145)

## 2020-05-17 LAB — GLUCOSE, CAPILLARY
Glucose-Capillary: 122 mg/dL — ABNORMAL HIGH (ref 70–99)
Glucose-Capillary: 118 mg/dL — ABNORMAL HIGH (ref 70–99)
Glucose-Capillary: 215 mg/dL — ABNORMAL HIGH (ref 70–99)
Glucose-Capillary: 151 mg/dL — ABNORMAL HIGH (ref 70–99)

## 2020-05-17 MED ORDER — AMLODIPINE BESYLATE 5 MG PO TABS
5.0000 mg | ORAL_TABLET | Freq: Every day | ORAL | Status: DC
  Administered 2020-05-17 – 2020-05-18 (×2): 5 mg via ORAL
  Filled 2020-05-17: qty 1

## 2020-05-17 NOTE — Progress Notes (Signed)
Occupational Therapy Evaluation (Late entry for 05/15/20)  PTA, pt lives alone and reports Independent in mobility and ADLs, but noted per chart, pt recently bedbound and unable to care for self. Pt A&Ox3 on eval, agreeable to therapies. Pt able to demo some movement of B LE for bed mobility, but requires Total A for EOB attempts. Due to decreased trunk flexion, unable to fully get EOB or attempt standing without second person assist. Pt requires up to Max A for UB ADLs, Total A for LB ADLs bed level at this time. Recommend SNF for short term rehab. Plan to progress endurance and transfers during ADLs in future sessions.    05/15/20 0700  OT Visit Information  Last OT Received On 05/15/20  Assistance Needed +2  History of Present Illness 75 yo admitted 1/26 with angioedema of the tongue presumably from ACE inhibitor, Intubated 1/26-1/29. PMHx: HTN, HLD, prostate CA, glaucoma, chronic back pain  Precautions  Precautions Fall  Restrictions  Weight Bearing Restrictions No  Home Living  Family/patient expects to be discharged to: Private residence  Living Arrangements Alone  Available Help at Discharge Friend(s);Family;Available PRN/intermittently  Type of [place] One level  Bathroom Shower/Tub Tub/shower unit  Tax adviser - 4 wheels;Other (comment) (lift chair)  Prior Function  Level of Independence Needs assistance  Gait / Transfers Assistance Needed per sister has not been out of bed for several months after 2 falls at home. Previously pt reports being able to walk with [employer]ator  ADL's / Homemaking Assistance Needed niece stops by to bathe and clean him up periodically. Pt will call and ask family to bring him meals. Sister sets out his medication for the week  Comments [name] sister providing PLOF and home setup as pt stating he walks and was independent PTA  Communication  Communication No difficulties   Pain Assessment  Pain Assessment Faces  Faces Pain Scale 2  Pain Location Back, L hip with flexion  Pain Descriptors / Indicators Grimacing;Sore  Pain Intervention(s) Monitored during session;Repositioned  Cognition  Arousal/Alertness Awake/alert  Behavior During Therapy Flat affect  Overall Cognitive Status Impaired/Different from baseline  Area of Impairment Memory;Orientation;Following commands;Safety/judgement;Awareness;Problem solving  Orientation Level Disoriented to;Situation  Memory Decreased short-term memory  Following Commands Follows one step commands with increased time  Safety/Judgement Decreased awareness of safety;Decreased awareness of deficits  Awareness Intellectual  Problem Solving Slow processing;Difficulty sequencing;Requires verbal cues;Requires tactile cues  General Comments Pt A&Ox3, answers date and location correctly but not oriented to situation at all. Reports he came to hospital because he got his R hand stuck in a machine, reports walking prior to admission though family reports pt being bed bound. Improved ability to follow commands, benefits from cues for sequencing.  Upper Extremity Assessment  Upper Extremity Assessment RUE deficits/detail;LUE deficits/detail  RUE Deficits / Details fair grasp, biceps/triceps 3+/5 but R shoulder with crepitation during flexion (actively to 50* passively to 85*)  RUE Coordination decreased gross motor  LUE Deficits / Details Pt with weaker grasp than R hand, biceps/triceps 3/5. R shoulder actively to 90*, passively WFL  LUE Coordination decreased gross motor  Lower Extremity Assessment  Lower Extremity Assessment Defer to PT evaluation  Cervical / Trunk Assessment  Cervical / Trunk Assessment Kyphotic  ADL  Overall ADL's  Needs assistance/impaired  Eating/Feeding Set up;Sitting  Eating/Feeding Details (indicate cue type and reason) Setup for breakfast, opening containers assist but able to hold coffee  cup and use  spoon to scoop coffee to mouth  Grooming Set up;Bed level  Grooming Details (indicate cue type and reason) setup to wash face in bed  Upper Body Bathing Maximal assistance;Bed level  Lower Body Bathing Total assistance;Bed level  Upper Body Dressing  Moderate assistance;Bed level  Lower Body Dressing Total assistance;Bed level  Toileting- Clothing Manipulation and Hygiene Total assistance;Bed level  General ADL Comments Limited by severe deconditioning, stiffness in trunk, weakness in all extremties. Pt with cognitive deficits, unaware of extent of physical deficits  Vision- History  Baseline Vision/History No visual deficits  Patient Visual Report No change from baseline  Vision- Assessment  Vision Assessment? No apparent visual deficits  Additional Comments able to read tray meal ticket without difficulty  Bed Mobility  Overal bed mobility Needs Assistance  Bed Mobility Supine to Sit;Sit to Supine  Supine to sit HOB elevated;Total assist  Sit to supine Total assist  General bed mobility comments Pt with improved ability to assist in moving LEs to EOB but unable to perform trunk flexion appropriately to fully sit upright EOB, Total A to return back to supine and Total A x 2 to scoot up in bed. Will need 2 person assist for full EOB attempts  Transfers  General transfer comment unable  Balance  Overall balance assessment Needs assistance  Sitting-balance support Bilateral upper extremity supported  Sitting balance-Leahy Scale Zero  Sitting balance - Comments unable to achieve sitting  General Comments  General comments (skin integrity, edema, etc.) VSS on RA.  OT - End of Session  Activity Tolerance Patient tolerated treatment well  Patient left in bed;with call bell/phone within reach;with bed alarm set  Nurse Communication Mobility status  OT Assessment  OT Recommendation/Assessment Patient needs continued OT Services  OT Visit Diagnosis Unsteadiness on feet (R26.81);Other  abnormalities of gait and mobility (R26.89);Muscle weakness (generalized) (M62.81);Other symptoms and signs involving cognitive function  OT Problem List Decreased strength;Decreased range of motion;Decreased activity tolerance;Impaired balance (sitting and/or standing);Decreased cognition;Decreased safety awareness;Impaired UE functional use;Decreased knowledge of use of DME or AE  OT Plan  OT Frequency (ACUTE ONLY) Min 2X/week  OT Treatment/Interventions (ACUTE ONLY) Self-care/ADL training;Therapeutic exercise;Energy conservation;DME and/or AE instruction;Therapeutic activities;Patient/family education;Balance training  AM-PAC OT "6 Clicks" Daily Activity Outcome Measure (Version 2)  Help from another person eating meals? 3  Help from another person taking care of personal grooming? 3  Help from another person toileting, which includes using toliet, bedpan, or urinal? 1  Help from another person bathing (including washing, rinsing, drying)? 2  Help from another person to put on and taking off regular upper body clothing? 2  Help from another person to put on and taking off regular lower body clothing? 1  6 Click Score 12  OT Recommendation  Follow Up Recommendations SNF;Supervision/Assistance - 24 hour  OT Equipment Other (comment) (to be determined pending progress)  Individuals Consulted  Consulted and Agree with Results and Recommendations Patient  Acute Rehab OT Goals  Patient Stated Goal return to normal if possible  OT Goal Formulation With patient  Time For Goal Achievement 05/29/20  Potential to Achieve Goals Good  OT Time Calculation  OT Start Time (ACUTE ONLY) 0745  OT Stop Time (ACUTE ONLY) 0807  OT Time Calculation (min) 22 min  OT General Charges  $OT Visit 1 Visit  OT Evaluation  $OT Eval Moderate Complexity 1 Mod  Written Expression  Dominant Hand Right

## 2020-05-17 NOTE — Progress Notes (Signed)
Occupational Therapy Treatment Patient Details Name: Bernard Roy MRN: [id number] DOB: 11-28-1945 Today's Date: 05/17/2020    History of present illness 75 yo admitted 1/26 with angioedema of the tongue presumably from ACE inhibitor, Intubated 1/26-1/29. PMHx: HTN, HLD, prostate CA, glaucoma, chronic back pain   OT comments  Pt slowly progressing towards acute OT goals. Bed level session as only +1 assist available. BUE/BLE A/AROM to toleration, deficits remain as detailed below. Also worked on pre-rolling tasks with pt able to reach across body with left arm to hold right bed rail. D/c plan remains appropriate.    Follow Up Recommendations  SNF;Supervision/Assistance - 24 hour    Equipment Recommendations  Other (comment) (TBD next venue)    Recommendations for Other Services      Precautions / Restrictions Precautions Precautions: Fall Restrictions Weight Bearing Restrictions: No       Mobility Bed Mobility Overal bed mobility: Needs Assistance             General bed mobility comments: total A +2 to roll per chart review and clinical judgement. He was able reach with his L arm to grab ahold of the right side bed rail. Encouraged him to utilize this when rolling with staff.  Transfers                      Balance                                           ADL either performed or assessed with clinical judgement   ADL Overall ADL's : Needs assistance/impaired Eating/Feeding: Set up;Sitting;Bed level Eating/Feeding Details (indicate cue type and reason): Bed in partial chair position. [facility] deficts noted. able to grossly hold cups and utilize utentil.                                   General ADL Comments: Limited by severe deconditioning, stiffness in trunk, weakness in all extremties. Pt with cognitive deficits, unaware of extent of physical deficits     Vision       Perception     Praxis      Cognition  Arousal/Alertness: Awake/alert Behavior During Therapy: Flat affect Overall Cognitive Status: Impaired/Different from baseline Area of Impairment: Memory;Orientation;Following commands;Safety/judgement;Awareness;Problem solving                 Orientation Level: Disoriented to;Situation   Memory: Decreased short-term memory Following Commands: Follows one step commands with increased time Safety/Judgement: Decreased awareness of safety;Decreased awareness of deficits Awareness: Intellectual Problem Solving: Slow processing;Difficulty sequencing;Requires verbal cues;Requires tactile cues General Comments: delayed responses when responding to questions. Flat affect. Oriented to person, place, and date. 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        Exercises     Shoulder Instructions       General Comments      Pertinent Vitals/ Pain       Pain Assessment: Faces Faces Pain Scale: Hurts even more Pain Location: R shoulder with movement. Pt reports injuring it recently while using his overhead trapeze bar in his bed. Pain Descriptors / Indicators: Grimacing;Sore Pain Intervention(s): Limited activity within patient's tolerance;Monitored during session;Repositioned (notified nursing)  Home Living Family/patient expects to be discharged to:: Skilled nursing facility Living Arrangements: Other relatives  Prior Functioning/Environment              Frequency  Min 2X/week        Progress Toward Goals  OT Goals(current goals can now be found in the care plan section)  Progress towards OT goals: Progressing toward goals (slowly progressing)  Acute Rehab OT Goals Patient Stated Goal: return to normal if possible OT Goal Formulation: With patient Time For Goal Achievement: 05/29/20 Potential to Achieve Goals: Fair ADL Goals Pt Will Perform Upper Body Bathing: with min assist;sitting Pt Will Perform Lower Body  Bathing: with mod assist;sitting/lateral leans;sit to/from stand Pt Will Perform Upper Body Dressing: with min assist;sitting Pt Will Transfer to Toilet: with max assist;stand pivot transfer;bedside commode Additional ADL Goal #1: Pt to demonstrate bed mobility at Mod A in prep for ADL transfers Additional ADL Goal #2: Pt to sit EOB > 3 minutes with no more than Min A to maintain sitting balance during ADLs  Plan Discharge plan remains appropriate    Co-evaluation                 AM-PAC OT "6 Clicks" Daily Activity     Outcome Measure   Help from another person eating meals?: A Little Help from another person taking care of personal grooming?: A Little Help from another person toileting, which includes using toliet, bedpan, or urinal?: Total Help from another person bathing (including washing, rinsing, drying)?: A Lot Help from another person to put on and taking off regular upper body clothing?: A Lot Help from another person to put on and taking off regular lower body clothing?: Total 6 Click Score: 12    End of Session    OT Visit Diagnosis: Unsteadiness on feet (R26.81);Other abnormalities of gait and mobility (R26.89);Muscle weakness (generalized) (M62.81);Other symptoms and signs involving cognitive function   Activity Tolerance Patient tolerated treatment well   Patient Left in bed;with call bell/phone within reach;with bed alarm set   Nurse Communication Other (comment) (how session went)        Time: 1150-1205 OT Time Calculation (min): 15 min  Charges: OT General Charges $OT Visit: 1 Visit OT Treatments $Self Care/Home Management : 8-22 mins  Tyrone Schimke, [place] Pager: [phone] Office: [place], [place] 05/17/2020, 12:49 PM

## 2020-05-17 NOTE — TOC Progression Note (Addendum)
Transition of Care [facility]) - Progression Note    Patient Details  Name: NOLYN SWAB MRN: [id number] Date of Birth: Oct 12, 1945  [facility] CM/SW Contact  Joanne Chars, LCSW Phone Number: 05/17/2020, 1:23 PM  Clinical Narrative:  CSW spoke with pt and with sister [name].  Sister requests CSW check with [facility] [address] at with [facility].  Sister was told by a [facility] contact that [facility] is [place] facility. 0900: [facility]--they are not managing this pt.  CSW spoke with Ebony Hail at [facility] of the nearby locations, [place], [place], or [place], can offer beds for this pt. CSW spoke with France Ravens at [facility] who is reviewing this referral and whether they are able to accept [place] funding.  1400: [facility] cannot offer bed for [address]. 1410: CSW met with pt and sister [name] at bedside.  After more discussion, they decided on accepting bed offer at [facility].  CSW informed Debbie at [facility] who confirmed bed offer and will initiate the authorization.  1600: Sister calls CSW, says they are reconsidering their choice.  Asked CSW to contact Steva Ready at [facility] who sister spoke to on phone and who told sister that she needed to authorize SNF placement.  CSW called and LM, also emailed Steva Ready.      Expected Discharge Plan: Skilled Nursing Facility Barriers to Discharge: SNF Pending bed offer  Expected Discharge Plan and Services Expected Discharge Plan: [place] In-house Referral: Clinical Social Work   Post Acute Care Choice: [place] Living arrangements for the past 2 months: Apartment                                       Social Determinants of Health (SDOH) Interventions    Readmission Risk Interventions No flowsheet data found.

## 2020-05-17 NOTE — Plan of Care (Signed)

## 2020-05-17 NOTE — Progress Notes (Signed)
PROGRESS NOTE    Bernard Roy  YQI:[id number] DOB: 07-15-1945 DOA: 05/09/2020 [facility]CP: [facility]   Chief Complain: Swelling of the tongue  Brief Narrative: Patient is a 10 male with history of hypertension, hyperlipidemia, prostate cancer who initially presented to the emergency department at [facility] hospital with complaint of swelling of the tongue.  Found to have severe angioedema suspected to be from ACE inhibitor use and was intubated in the emergency department and was transferred to [facility].  He was extubated on 05/12/2020.  Hospital course remarkable for anasarca/volume overload, purulent discharge from the urethral meatus, scrotal swelling,UTI. Volume overload improved with  IV diuresis .  PT/OT recommended skilled nursing facility on DC.  He is medically stable for discharge to skilled nursing facility as soon as  bed is available.  Assessment & Plan:   Principal Problem:   Angioedema Active Problems:   HTN (hypertension)   Acute respiratory failure with hypoxemia (HCC)   Airway obstruction   Pressure injury of skin  Acute respiratory failure: Secondary to progressive angioedema.  Intubated for airway protection.  Currently successfully extubated.  Respiratory status is currently stable.  Angioedema: Suspected to be from lisinopril he was taking.  Treated prednisone, H1, H2 blockers.    Volume overload: Patient looked extremely volume overloaded, BNP normal.  Could be secondary to fluids given during the hospitalization but also could be contributed from hypoalbuminemia and limited mobility.  Treated with IV lasix  .  Patient also had scrotal swelling,much improved now.  Urology recommended jockstrap for scrotal support when out of bed. Continue oral Lasix on discharge.  Acute exacerbation of diastolic congestive heart failure: Echo showed ejection fraction 60 to 38%, grade 1 diastolic dysfunction.  Continue Lasix 40 mg daily .  Hypertension: Currently blood  pressure stable.  On Norvasc and lisinopril at home.  We will permanently discontinue lisinopril for suspected angioedema.  Hyperlipidemia: On statin at home  Leukocytosis/suspected UTI: Suspected  secondary to steroids/reactive. But there was a report of significant purulence around the Foley catheter/urinary meatus. Started on ceftriaxone.  Urine culture showed Enterobacter aerogens,  antibiotics to Cipro .  Blood cultures negative so far.    Urinary retention: Foley has been removed.   On tamsulosin  Morbid obesity: BMI of 37.08  Ambulatory difficulty/deconditioning: PT/OT recommended skilled nursing facility on discharge.  Social worker consulted.  Nutrition Problem: Inadequate oral intake Etiology: inability to eat      DVT prophylaxis:lovenox Code Status: Full Family Communication: Called sister on phone on 05/17/20 for update Status is: Inpatient  Remains inpatient appropriate because:Inpatient level of care appropriate due to severity of illness   Dispo: The patient is from: Home              Anticipated d/c is to: SNF              Anticipated d/c date is: as soon as bed is available              Patient currently is medically stable for dc   Difficult to place patient : Yes Anticipating few more days of hospitalization because he has insurance  from [place]. SW following    Consultants: PCCM  Procedures:Intubation  Antimicrobials:  Anti-infectives (From admission, onward)   Start     Dose/Rate Route Frequency Ordered Stop   05/17/20 0800  ciprofloxacin (CIPRO) tablet 500 mg        500 mg Oral 2 times daily 05/16/20 1255 05/19/20 0759  05/14/20 0115  cefTRIAXone (ROCEPHIN) 1 g in sodium chloride 0.9 % 100 mL IVPB  Status:  Discontinued        1 g 200 mL/hr over 30 Minutes Intravenous Every 24 hours 05/14/20 0017 05/16/20 1255      Subjective:  Patient seen and examined the bedside this morning.  Lying on the bed .  Denies any complaints.  He still has trace  bilateral lower extremity edema.  Comfortable.    Objective: Vitals:   05/16/20 0534 05/16/20 1645 05/16/20 2032 05/17/20 0531  BP: (!) 155/76 (!) 144/74 (!) 152/70 140/72  Pulse: 80 83 85 78  Resp: 20 20 18 18   Temp: 98 F (36.7 C) 98.6 F (37 C) 99.4 F (37.4 C) 99.8 F (37.7 C)  TempSrc: Oral  Oral Oral  SpO2: 97% 98% 100% 100%  Weight:      Height:        Intake/Output Summary (Last 24 hours) at 05/17/2020 0750 Last data filed at 05/17/2020 0500 Gross per 24 hour  Intake --  Output 1900 ml  Net -1900 ml   Filed Weights   05/09/20 1647 05/10/20 1318 05/12/20 0400  Weight: 127 kg 134.4 kg 131 kg    Examination:  General exam: Appears calm and comfortable ,morbidly obese HEENT:PERRL,Oral mucosa moist, Ear/Nose normal on gross exam Respiratory system: Bilateral equal air entry, normal vesicular breath sounds, no wheezes or crackles  Cardiovascular system: S1 & S2 heard, RRR. No JVD, murmurs, rubs, gallops or clicks. Gastrointestinal system: Abdomen is nondistended, soft and nontender. No organomegaly or masses felt. Normal bowel sounds heard. Central nervous system: Alert and oriented. No focal neurological deficits. Extremities: trace bilateral  lower extremity edema Skin: No rashes, lesions or ulcers,no icterus ,no pallor GU: Mild scrotal swelling   Data Reviewed: I have personally reviewed following labs and imaging studies  CBC: Recent Labs  Lab 05/13/20 1018 05/14/20 0230 05/15/20 0216 05/16/20 0445 05/17/20 0231  WBC 16.2* 12.1* 14.4* 14.2* 9.8  NEUTROABS 13.7* 9.8* 11.9* 11.2* 7.4  HGB 12.0* 11.0* 11.8* 10.9* 10.9*  HCT 36.2* 32.8* 36.4* 31.7* 34.1*  MCV 92.3 92.7 92.4 91.4 94.5  PLT 266 226 263 219 [id number]   Basic Metabolic Panel: Recent Labs  Lab 05/11/20 0448 05/12/20 0354 05/13/20 1018 05/14/20 0230 05/15/20 0216 05/16/20 0445  NA 136 139 142 142 142 141  K 3.8 3.6 3.3* 3.3* 4.0 4.0  CL 105 107 110 110 105 103  CO2 22 21* 22 23 26 30    GLUCOSE 226* 217* 133* 143* 205* 176*  BUN 20 29* 25* 19 19 24*  CREATININE 0.83 1.22 0.97 0.91 0.94 0.90  CALCIUM 8.7* 8.3* 8.5* 8.2* 8.8* 8.8*  MG 1.8 1.9  --   --   --   --    GFR: Estimated Creatinine Clearance: 103.6 mL/min (by C-G formula based on SCr of 0.9 mg/dL). Liver Function Tests: Recent Labs  Lab 05/15/20 0216  AST 41  ALT 74*  ALKPHOS 101  BILITOT 0.7  PROT 5.9*  ALBUMIN 2.5*   No results for input(s): LIPASE, AMYLASE in the last 168 hours. No results for input(s): AMMONIA in the last 168 hours. Coagulation Profile: No results for input(s): INR, PROTIME in the last 168 hours. Cardiac Enzymes: No results for input(s): CKTOTAL, CKMB, CKMBINDEX, TROPONINI in the last 168 hours. BNP (last 3 results) No results for input(s): PROBNP in the last 8760 hours. HbA1C: Recent Labs    05/14/20 1801  HGBA1C 7.1*  CBG: Recent Labs  Lab 05/16/20 0814 05/16/20 1205 05/16/20 1643 05/16/20 2035 05/17/20 0735  GLUCAP 149* 163* 131* 229* 122*   Lipid Profile: No results for input(s): CHOL, HDL, LDLCALC, TRIG, CHOLHDL, LDLDIRECT in the last 72 hours. Thyroid Function Tests: No results for input(s): TSH, T4TOTAL, FREET4, T3FREE, THYROIDAB in the last 72 hours. Anemia Panel: No results for input(s): VITAMINB12, FOLATE, FERRITIN, TIBC, IRON, RETICCTPCT in the last 72 hours. Sepsis Labs: Recent Labs  Lab 05/11/20 0448  PROCALCITON <0.10    Recent Results (from the past 240 hour(s))  SARS CORONAVIRUS 2 (TAT 6-24 HRS) Nasopharyngeal Nasopharyngeal Swab     Status: None   Collection Time: 05/09/20  5:16 PM   Specimen: Nasopharyngeal Swab  Result Value Ref Range Status   SARS Coronavirus 2 NEGATIVE NEGATIVE Final    Comment: (NOTE) SARS-CoV-2 target nucleic acids are NOT DETECTED.  The SARS-CoV-2 RNA is generally detectable in upper and lower respiratory specimens during the acute phase of infection. Negative results do not preclude SARS-CoV-2 infection, do not  rule out co-infections with other pathogens, and should not be used as the sole basis for treatment or other patient management decisions. Negative results must be combined with clinical observations, patient history, and epidemiological information. The expected result is Negative.  Fact Sheet for Patients: SugarRoll.be  Fact Sheet for Healthcare Providers: https://www.woods-mathews.com/  This test is not yet approved or cleared by the [place] FDA and  has been authorized for detection and/or diagnosis of SARS-CoV-2 by FDA under an Emergency Use Authorization (EUA). This EUA will remain  in effect (meaning this test can be used) for the duration of the COVID-19 declaration under Se ction 564(b)(1) of the Act, 21 U.S.C. section 360bbb-3(b)(1), unless the authorization is terminated or revoked sooner.  Performed at [facility], [place] [address], [place], [place] [address]   MRSA PCR Screening     Status: None   Collection Time: 05/10/20  1:28 PM   Specimen: Nasal Mucosa; Nasopharyngeal  Result Value Ref Range Status   MRSA by PCR NEGATIVE NEGATIVE Final    Comment:        The GeneXpert MRSA Assay (FDA approved for NASAL specimens only), is one component of a comprehensive MRSA colonization surveillance program. It is not intended to diagnose MRSA infection nor to guide or monitor treatment for MRSA infections. Performed at [facility], [address], [place], [place] [address]   Culture, blood (routine x 2)     Status: None   Collection Time: 05/11/20  1:15 PM   Specimen: BLOOD LEFT WRIST  Result Value Ref Range Status   Specimen Description BLOOD LEFT WRIST  Final   Special Requests   Final    BOTTLES DRAWN AEROBIC AND ANAEROBIC Blood Culture adequate volume   Culture   Final    NO GROWTH 5 DAYS Performed at [facility], [address] [place], [place] [address]    Report Status 05/16/2020 FINAL  Final  Culture,  blood (routine x 2)     Status: None   Collection Time: 05/11/20  1:17 PM   Specimen: BLOOD  Result Value Ref Range Status   Specimen Description BLOOD LEFT ANTECUBITAL  Final   Special Requests   Final    BOTTLES DRAWN AEROBIC AND ANAEROBIC Blood Culture adequate volume   Culture   Final    NO GROWTH 5 DAYS Performed at [facility], [address], [place], [place] [address]    Report Status 05/16/2020  FINAL  Final  Culture, Urine     Status: Abnormal   Collection Time: 05/14/20  1:00 AM   Specimen: Urine, Random  Result Value Ref Range Status   Specimen Description URINE, RANDOM  Final   Special Requests   Final    NONE Performed at [facility], [address], [place], [place] [address]    Culture >=100,000 COLONIES/mL ENTEROBACTER AEROGENES (A)  Final   Report Status 05/16/2020 FINAL  Final   Organism ID, Bacteria ENTEROBACTER AEROGENES (A)  Final      Susceptibility   Enterobacter aerogenes - MIC*    CEFAZOLIN >=64 RESISTANT Resistant     CEFEPIME <=0.12 SENSITIVE Sensitive     CEFTRIAXONE <=0.25 SENSITIVE Sensitive     CIPROFLOXACIN <=0.25 SENSITIVE Sensitive     GENTAMICIN <=1 SENSITIVE Sensitive     IMIPENEM 2 SENSITIVE Sensitive     NITROFURANTOIN 64 INTERMEDIATE Intermediate     TRIMETH/SULFA <=20 SENSITIVE Sensitive     PIP/TAZO <=4 SENSITIVE Sensitive     * >=100,000 COLONIES/mL ENTEROBACTER AEROGENES  SARS CORONAVIRUS 2 (TAT 6-24 HRS) Nasopharyngeal Nasopharyngeal Swab     Status: None   Collection Time: 05/16/20  2:14 PM   Specimen: Nasopharyngeal Swab  Result Value Ref Range Status   SARS Coronavirus 2 NEGATIVE NEGATIVE Final    Comment: (NOTE) SARS-CoV-2 target nucleic acids are NOT DETECTED.  The SARS-CoV-2 RNA is generally detectable in upper and lower respiratory specimens during the acute phase of infection. Negative results do not preclude SARS-CoV-2 infection, do not rule out co-infections with other pathogens, and should not be used as  the sole basis for treatment or other patient management decisions. Negative results must be combined with clinical observations, patient history, and epidemiological information. The expected result is Negative.  Fact Sheet for Patients: SugarRoll.be  Fact Sheet for Healthcare Providers: https://www.woods-mathews.com/  This test is not yet approved or cleared by the [place] FDA and  has been authorized for detection and/or diagnosis of SARS-CoV-2 by FDA under an Emergency Use Authorization (EUA). This EUA will remain  in effect (meaning this test can be used) for the duration of the COVID-19 declaration under Se ction 564(b)(1) of the Act, 21 U.S.C. section 360bbb-3(b)(1), unless the authorization is terminated or revoked sooner.  Performed at [facility], [place] [address], [place], [place] [address]          Radiology Studies: No results found.      Scheduled Meds:  (feeding supplement) PROSource Plus  30 mL Oral BID BM   chlorhexidine  15 mL Mouth Rinse BID   Chlorhexidine Gluconate Cloth  6 each Topical Daily   ciprofloxacin  500 mg Oral BID   docusate  100 mg Per Tube BID   enoxaparin (LOVENOX) injection  65 mg Subcutaneous QHS   famotidine  20 mg Oral BID   feeding supplement  237 mL Oral TID BM   furosemide  40 mg Oral Daily   insulin aspart  0-20 Units Subcutaneous TID WC   insulin aspart  0-5 Units Subcutaneous QHS   mouth rinse  15 mL Mouth Rinse q12n4p   polyethylene glycol  17 g Per Tube Daily   sodium chloride flush  10-40 mL Intracatheter Q12H   tamsulosin  0.4 mg Oral BID   Continuous Infusions:    LOS: 8 days    Time spent: 35 mins.More than 50% of that time was spent in counseling and/or coordination of care.  Shelly Coss, MD [facility] P2/06/2020, 7:50 AM

## 2020-05-18 DIAGNOSIS — T783XXS Angioneurotic edema, sequela: Secondary | ICD-10-CM | POA: Diagnosis not present

## 2020-05-18 DIAGNOSIS — I10 Essential (primary) hypertension: Secondary | ICD-10-CM | POA: Diagnosis not present

## 2020-05-18 DIAGNOSIS — J9601 Acute respiratory failure with hypoxia: Secondary | ICD-10-CM | POA: Diagnosis not present

## 2020-05-18 DIAGNOSIS — J988 Other specified respiratory disorders: Secondary | ICD-10-CM | POA: Diagnosis not present

## 2020-05-18 LAB — BASIC METABOLIC PANEL
Anion gap: 10 (ref 5–15)
BUN: 20 mg/dL (ref 8–23)
CO2: 28 mmol/L (ref 22–32)
Calcium: 8.4 mg/dL — ABNORMAL LOW (ref 8.9–10.3)
Chloride: 99 mmol/L (ref 98–111)
Creatinine, Ser: 0.96 mg/dL (ref 0.61–1.24)
GFR, Estimated: >60 mL/min (ref >60)
Glucose, Bld: 202 mg/dL — ABNORMAL HIGH (ref 70–99)
Potassium: 3.8 mmol/L (ref 3.5–5.1)
Sodium: 137 mmol/L (ref 135–145)

## 2020-05-18 LAB — GLUCOSE, CAPILLARY
Glucose-Capillary: 210 mg/dL — ABNORMAL HIGH (ref 70–99)
Glucose-Capillary: 221 mg/dL — ABNORMAL HIGH (ref 70–99)
Glucose-Capillary: 214 mg/dL — ABNORMAL HIGH (ref 70–99)
Glucose-Capillary: 198 mg/dL — ABNORMAL HIGH (ref 70–99)

## 2020-05-18 MED ORDER — POLYETHYLENE GLYCOL 3350 17 G PO PACK: 17.0000 g | PACK | Freq: Every day | ORAL | 0 refills | Status: AC

## 2020-05-18 MED ORDER — ORAL CARE MOUTH RINSE: 15.0000 mL | Freq: Two times a day (BID) | OROMUCOSAL | 0 refills | Status: AC

## 2020-05-18 MED ORDER — CIPROFLOXACIN HCL 500 MG PO TABS: 500.0000 mg | ORAL_TABLET | Freq: Two times a day (BID) | ORAL | 0 refills | Status: AC

## 2020-05-18 MED ORDER — FAMOTIDINE 20 MG PO TABS: 20.0000 mg | ORAL_TABLET | Freq: Two times a day (BID) | ORAL | 0 refills | Status: AC

## 2020-05-18 MED ORDER — INSULIN ASPART 100 UNIT/ML ~~LOC~~ SOLN
0.0000 [IU] | Freq: Every day | SUBCUTANEOUS | 11 refills | Status: AC
Start: 2020-05-18 — End: ?

## 2020-05-18 MED ORDER — CARVEDILOL 3.125 MG PO TABS: 1.5625 mg | ORAL_TABLET | Freq: Two times a day (BID) | ORAL | 11 refills | Status: AC

## 2020-05-18 MED ORDER — HYDROCODONE-ACETAMINOPHEN 5-325 MG PO TABS
1.0000 | ORAL_TABLET | Freq: Every day | ORAL | 0 refills | Status: AC | PRN
Start: 2020-05-18 — End: ?

## 2020-05-18 MED ORDER — INSULIN ASPART 100 UNIT/ML ~~LOC~~ SOLN: 0.0000 [IU] | Freq: Three times a day (TID) | SUBCUTANEOUS | 11 refills | Status: AC

## 2020-05-18 MED ORDER — POLYETHYLENE GLYCOL 3350 17 G PO PACK
17.0000 g | PACK | Freq: Every day | ORAL | Status: DC
  Administered 2020-05-18: 17 g via ORAL
  Filled 2020-05-18: qty 1

## 2020-05-18 MED ORDER — ENSURE ENLIVE PO LIQD: 237.0000 mL | Freq: Three times a day (TID) | ORAL | 12 refills | Status: AC

## 2020-05-18 MED ORDER — PROSOURCE PLUS PO LIQD: 30.0000 mL | Freq: Two times a day (BID) | ORAL | 0 refills | Status: AC

## 2020-05-18 MED ORDER — DOCUSATE SODIUM 100 MG PO CAPS: 100.0000 mg | ORAL_CAPSULE | Freq: Two times a day (BID) | ORAL | 0 refills | Status: AC

## 2020-05-18 MED ORDER — FUROSEMIDE 40 MG PO TABS: 40.0000 mg | ORAL_TABLET | Freq: Every day | ORAL | 1 refills | Status: AC

## 2020-05-18 MED ORDER — DOCUSATE SODIUM 100 MG PO CAPS
100.0000 mg | ORAL_CAPSULE | Freq: Two times a day (BID) | ORAL | Status: DC
  Administered 2020-05-18: 100 mg via ORAL
  Filled 2020-05-18: qty 1

## 2020-05-18 MED ORDER — POLYETHYLENE GLYCOL 3350 17 G PO PACK: 17.0000 g | PACK | Freq: Every day | ORAL | 0 refills | Status: AC | PRN

## 2020-05-18 NOTE — Discharge Summary (Signed)
Physician Discharge Summary  Patient ID: Bernard Roy MRN: [id number] DOB/AGE: 15-Jun-1945 75 y.o.  Admit date: 05/09/2020 Discharge date: 05/18/2020  Admission Diagnoses:  Discharge Diagnoses:  Principal Problem:   Angioedema Active Problems:   HTN (hypertension)   Acute respiratory failure with hypoxemia (HCC)   Airway obstruction   Pressure injury of skin Type II diabetes mellitus (new diagnosis for patient)  Discharged Condition: stable  Hospital Course:  Patient is a 75 year old African-American male with past medical history significant for hypertension, hyperlipidemia and prostate cancer.  Patient presented to the hospital with swelling of the tongue.  Patient was found to have severe angioedema suspected to be secondary to ACE inhibitor use.  Patient was intubated in the emergency department and was transferred to [facility].  The patient was eventually extubated on 05/12/2020.  Hospital course was also significant for anasarca/volume overload, purulent discharge from the urethral meatus and scrotal swelling.  Urine culture grew Enterobacter aerogenes.  With diuresis, anasarca improved significantly.  Echocardiogram revealed normal ejection fraction, grade 1 diastolic dysfunction and the inferior vena cava was dilated in size with less than 50% respiratory variability, suggesting right atrial pressure of 15 mmHg.  Physical therapy has recommended discharge to skilled nursing facility.  Patient will be discharged to skilled nursing facility.  Acute respiratory failure:  -Secondary to progressive angioedema.   -Intubated for airway protection.   -Patient was successfully extubated.   -Respiratory status has remained stable.   Angioedema:  Suspected to be secondary to lisinopril.   -Patient was treated with prednisone, H1, H2 blockers.  -Angioedema has resolved.   -Patient is tolerating diet   Volume overload:  -Etiology remains unclear.   -Echocardiogram revealed  diastolic dysfunction, dilated inferior vena cava with less than 50% respiratory variability.   -Has low albumin (2.5)   Possible acute on chronic diastolic congestive heart failure:  -Echo showed ejection fraction 60 to 123456, grade 1 diastolic dysfunction.   -Patient will be discharged on oral Lasix. -We will add low-dose Coreg 1.5625 milligrams twice daily.   Hypertension:  -Continue to monitor and optimize. -Lisinopril was discontinued.    Hyperlipidemia:  On statin at home  Leukocytosis/suspected UTI:  --Urine culture grew Enterobacter. -Patient will complete course of antibiotics.    Urinary retention:  -Foley has been removed.   -On tamsulosin.    Class II obesity:   -BMI of 37.08  Ambulatory difficulty/deconditioning:  -PT/OT recommended skilled nursing facility on discharge.  -Patient will be discharged to skilled nursing facility.   Nutrition Problem/moderate malnutrition: -Discharge patient on supplements.    Consults: pulmonary/intensive care and urology  Significant Diagnostic Studies:  Echocardiogram revealed: 1. Left ventricular ejection fraction, by estimation, is 60 to 65%. The  left ventricle has normal function. The left ventricle has no regional  wall motion abnormalities. Left ventricular diastolic parameters are  consistent with Grade I diastolic  dysfunction (impaired relaxation).  2. Right ventricular systolic function is normal. The right ventricular  size is normal. Tricuspid regurgitation signal is inadequate for assessing  PA pressure.  3. Left atrial size was mildly dilated.  4. The mitral valve is normal in structure. No evidence of mitral valve  regurgitation. No evidence of mitral stenosis.  5. The aortic valve is tricuspid. Aortic valve regurgitation is not  visualized. Mild aortic valve sclerosis is present, with no evidence of  aortic valve stenosis.  6. Aortic dilatation noted. There is mild dilatation of the ascending   aorta, measuring 38 mm.  7.  The inferior vena cava is dilated in size with <50% respiratory  variability, suggesting right atrial pressure of 15 mmHg.  8. There is a trivial pericardial effusion posterior to the left  ventricle.    Discharge Exam: Blood pressure 130/62, pulse 91, temperature 99.6 F (37.6 C), resp. rate 16, height 6\' 2"  (1.88 m), weight 131 kg, SpO2 94 %.  Disposition: Discharge disposition: 03-Skilled Nursing Facility   Discharge Instructions    Diet - low sodium heart healthy   Complete by: As directed    Diet Carb Modified   Complete by: As directed    Discharge wound care:   Complete by: As directed    Continue current wound care plan   Increase activity slowly   Complete by: As directed      Allergies as of 05/18/2020      Reactions   Lisinopril Swelling   Angioedema/tongue swelling, requiring mechanical intubation. 05/09/20.      Medication List    STOP taking these medications   Dry Eye Relief Drops 0.2-0.2-1 % Soln Generic drug: Glycerin-Hypromellose-PEG 400   hydrOXYzine 10 MG tablet Commonly known as: ATARAX/VISTARIL   lisinopril 40 MG tablet Commonly known as: ZESTRIL   zolpidem 10 MG tablet Commonly known as: AMBIEN     TAKE these medications   (feeding supplement) PROSource Plus liquid Take 30 mLs by mouth 2 (two) times daily between meals.   feeding supplement Liqd Take 237 mLs by mouth 3 (three) times daily between meals.   acetaminophen 325 MG tablet Commonly known as: TYLENOL Take 500 mg by mouth 2 (two) times daily as needed for mild pain or moderate pain.   amLODipine 5 MG tablet Commonly known as: NORVASC Take 5 mg by mouth daily at 12 noon.   atorvastatin 80 MG tablet Commonly known as: LIPITOR Take 20 mg by mouth at bedtime.   carvedilol 3.125 MG tablet Commonly known as: Coreg Take 0.5 tablets (1.5625 mg total) by mouth 2 (two) times daily.   ciprofloxacin 500 MG tablet Commonly known as: CIPRO Take 1  tablet (500 mg total) by mouth 2 (two) times daily for 2 doses.   docusate sodium 100 MG capsule Commonly known as: COLACE Take 1 capsule (100 mg total) by mouth 2 (two) times daily.   famotidine 20 MG tablet Commonly known as: PEPCID Take 1 tablet (20 mg total) by mouth 2 (two) times daily.   furosemide 40 MG tablet Commonly known as: LASIX Take 1 tablet (40 mg total) by mouth daily. Start taking on: May 19, 2020   HYDROcodone-acetaminophen 5-325 MG tablet Commonly known as: NORCO/VICODIN Take 1 tablet by mouth daily as needed for moderate pain.   insulin aspart 100 UNIT/ML injection Commonly known as: novoLOG Inject 0-20 Units into the skin 3 (three) times daily with meals.   insulin aspart 100 UNIT/ML injection Commonly known as: novoLOG Inject 0-5 Units into the skin at bedtime.   melatonin 3 MG Tabs tablet Take 3 mg by mouth at bedtime.   mouth rinse Liqd solution 15 mLs by Mouth Rinse route 2 times daily at 12 noon and 4 pm.   polyethylene glycol 17 g packet Commonly known as: MIRALAX / GLYCOLAX Take 17 g by mouth daily as needed for mild constipation.   polyethylene glycol 17 g packet Commonly known as: MIRALAX / GLYCOLAX Take 17 g by mouth daily. Start taking on: May 19, 2020   tamsulosin 0.4 MG Caps capsule Commonly known as: FLOMAX Take 0.4 mg by  mouth in the morning and at bedtime.   timolol 0.5 % ophthalmic solution Commonly known as: BETIMOL Place 1 drop into both eyes 2 (two) times daily.            Discharge Care Instructions  (From admission, onward)         Start     Ordered   05/18/20 0000  Discharge wound care:       Comments: Continue current wound care plan   05/18/20 1303          Contact information for after-discharge care    Destination    [facility] .   Service: Skilled Nursing Contact information: [place] [phone]                   Signed: Bonnell Public 05/18/2020, 1:24 PM

## 2020-05-18 NOTE — TOC Progression Note (Signed)
Transition of Care [facility]) - Progression Note    Patient Details  Name: Bernard Roy MRN: [id number] Date of Birth: Oct 22, 1945  [facility] CM/SW Contact  Joanne Chars, LCSW Phone Number: 05/18/2020, 11:15 AM  Clinical Narrative:   CSW received email from [place].  If pt is authorized through [facility], that is fine.  If CSW wants additional auth/options from [place] would need to submit auth to her.  CSW contacted the following facilities that have higher star ratings: [facility], 3 star: they are not [place], 2 star-they would need [employer], will review [facility]: 4 star.  Not [facility].  Will review.   [facility], 3 star.  Left message [facility], 4 star, will review [employer] [facility], 3 star--CSW again asked if they could accept pt under his [employer].  Called and texted, no response.   [facility]-3 star.  No response.  CSW spoke with pt sister [name] who reports that they have decided to move forward with [facility] in [place].  They decided that using a [facility] facility was the most important factor as they are worried pt may need more time than he would get under his [facility] Medicare.  CSW contacted CSW contacted Carollee Leitz at [facility] point and she confirmed they do have bed today.  Need auth.  CSW contacted Clayburn Pert, [place] regarding authorization.  She asked for information and said she will call [facility] with auth.  SHe also said she is having to work with [facility] as this pt is [employer] pt.  In process.  Clayburn Pert also said ambulance transport could bill [facility] for the [facility].  CSW spoke with [facility] who said they can transport [place] pt.      Expected Discharge Plan: Skilled Nursing Facility Barriers to Discharge: SNF Pending bed offer  Expected Discharge Plan and Services Expected Discharge Plan: [place] In-house Referral: Clinical Social Work   Post Acute  Care Choice: [place] Living arrangements for the past 2 months: Apartment                                       Social Determinants of Health (SDOH) Interventions    Readmission Risk Interventions No flowsheet data found.

## 2020-05-18 NOTE — TOC Transition Note (Signed)
Transition of Care [facility]) - CM/SW Discharge Note   Patient Details  Name: Bernard Roy MRN: [id number] Date of Birth: [birth year]  [facility] CM/SW Contact:  Joanne Chars, LCSW Phone Number: 05/18/2020, 2:07 PM   Clinical Narrative:   Pt discharging to [facility], room 128.  RN call report to [phone].  [facility] to transport.     Final next level of care: Skilled Nursing Facility Barriers to Discharge: Barriers Resolved   Patient Goals and CMS Choice Patient states their goals for this hospitalization and ongoing recovery are:: "get back to where I was, independent" CMS Medicare.gov Compare Post Acute Care list provided to::  ([facility] patient.  Waiting on options from [place])    Discharge Placement              Patient chooses bed at:  ([facility]) Patient to be transferred to facility by: [facility] Name of family member notified: sister [name] Patient and family notified of of transfer: 05/18/20  Discharge Plan and Services In-house Referral: Clinical Social Work   Post Acute Care Choice: [place]                               Social Determinants of Health (SDOH) Interventions     Readmission Risk Interventions No flowsheet data found.

## 2020-05-18 NOTE — Plan of Care (Signed)

## 2020-05-18 NOTE — Progress Notes (Signed)
Report called to [place] at [facility].

## 2020-05-18 NOTE — Progress Notes (Signed)
Results for JUBAL, RADEMAKER (MRN [id number]) as of 05/18/2020 09:05  Ref. Range 05/14/2020 18:01  Hemoglobin A1C Latest Ref Range: 4.8 - 5.6 % 7.1 (H)  Noted that HgbA1C is 7.1% which would be a diagnosis of diabetes by the American Diabetes Association. Diabetes is not listed in patient's history.   Harvel Ricks RN BSN CDE Diabetes Coordinator Pager: [phone]  8am-5pm

## 2020-05-21 DIAGNOSIS — R5381 Other malaise: Secondary | ICD-10-CM | POA: Diagnosis not present

## 2020-05-21 DIAGNOSIS — T783XXA Angioneurotic edema, initial encounter: Secondary | ICD-10-CM | POA: Diagnosis not present

## 2020-05-22 ENCOUNTER — Other Ambulatory Visit: Payer: Self-pay

## 2020-05-22 DIAGNOSIS — L89323 Pressure ulcer of left buttock, stage 3: Secondary | ICD-10-CM | POA: Diagnosis not present

## 2020-05-22 NOTE — Patient Outreach (Signed)
[place] [facility]) Care Management  05/22/2020  OLSEN MCCUTCHAN Dec 08, 1945 [id number]   Referral Date: 05/22/20 Referral Source: [employer] Report Date of Discharge: 05/19/20 Facility:  [place]: [facility]   Referral received.  No outreach warranted at this time. Patient discharged to SNF.      Plan: RN CM will close case.    Jone Baseman, RN, MSN [facility] Care Management Care Management Coordinator Direct Line [phone] Toll Free: [phone]  Fax: [phone]

## 2020-05-23 DIAGNOSIS — M19011 Primary osteoarthritis, right shoulder: Secondary | ICD-10-CM | POA: Diagnosis not present

## 2020-05-25 DIAGNOSIS — N3000 Acute cystitis without hematuria: Secondary | ICD-10-CM | POA: Diagnosis not present

## 2020-05-25 DIAGNOSIS — I5032 Chronic diastolic (congestive) heart failure: Secondary | ICD-10-CM | POA: Diagnosis not present

## 2020-05-25 DIAGNOSIS — J9600 Acute respiratory failure, unspecified whether with hypoxia or hypercapnia: Secondary | ICD-10-CM | POA: Diagnosis not present

## 2020-05-25 DIAGNOSIS — E8770 Fluid overload, unspecified: Secondary | ICD-10-CM | POA: Diagnosis not present

## 2020-05-25 DIAGNOSIS — T783XXD Angioneurotic edema, subsequent encounter: Secondary | ICD-10-CM | POA: Diagnosis not present

## 2020-05-25 DIAGNOSIS — I110 Hypertensive heart disease with heart failure: Secondary | ICD-10-CM | POA: Diagnosis not present

## 2020-05-25 DIAGNOSIS — R339 Retention of urine, unspecified: Secondary | ICD-10-CM | POA: Diagnosis not present

## 2020-05-25 DIAGNOSIS — Z888 Allergy status to other drugs, medicaments and biological substances status: Secondary | ICD-10-CM | POA: Diagnosis not present

## 2020-05-29 DIAGNOSIS — L89323 Pressure ulcer of left buttock, stage 3: Secondary | ICD-10-CM | POA: Diagnosis not present

## 2020-06-01 DIAGNOSIS — T783XXD Angioneurotic edema, subsequent encounter: Secondary | ICD-10-CM | POA: Diagnosis not present

## 2020-06-01 DIAGNOSIS — D72829 Elevated white blood cell count, unspecified: Secondary | ICD-10-CM | POA: Diagnosis not present

## 2020-06-01 DIAGNOSIS — R7401 Elevation of levels of liver transaminase levels: Secondary | ICD-10-CM | POA: Diagnosis not present

## 2020-06-01 DIAGNOSIS — E440 Moderate protein-calorie malnutrition: Secondary | ICD-10-CM | POA: Diagnosis not present

## 2020-06-01 DIAGNOSIS — D649 Anemia, unspecified: Secondary | ICD-10-CM | POA: Diagnosis not present

## 2020-06-09 DIAGNOSIS — M6281 Muscle weakness (generalized): Secondary | ICD-10-CM | POA: Diagnosis not present

## 2020-06-09 DIAGNOSIS — I10 Essential (primary) hypertension: Secondary | ICD-10-CM | POA: Diagnosis not present

## 2020-06-19 DIAGNOSIS — L89323 Pressure ulcer of left buttock, stage 3: Secondary | ICD-10-CM | POA: Diagnosis not present

## 2020-06-25 DIAGNOSIS — T783XXD Angioneurotic edema, subsequent encounter: Secondary | ICD-10-CM | POA: Diagnosis not present

## 2020-06-25 DIAGNOSIS — R5381 Other malaise: Secondary | ICD-10-CM | POA: Diagnosis not present

## 2020-06-25 DIAGNOSIS — E8770 Fluid overload, unspecified: Secondary | ICD-10-CM | POA: Diagnosis not present

## 2020-06-25 DIAGNOSIS — E119 Type 2 diabetes mellitus without complications: Secondary | ICD-10-CM | POA: Diagnosis not present

## 2020-06-25 DIAGNOSIS — I110 Hypertensive heart disease with heart failure: Secondary | ICD-10-CM | POA: Diagnosis not present

## 2020-06-25 DIAGNOSIS — J9600 Acute respiratory failure, unspecified whether with hypoxia or hypercapnia: Secondary | ICD-10-CM | POA: Diagnosis not present

## 2020-06-26 DIAGNOSIS — L89323 Pressure ulcer of left buttock, stage 3: Secondary | ICD-10-CM | POA: Diagnosis not present

## 2020-07-02 DIAGNOSIS — J9600 Acute respiratory failure, unspecified whether with hypoxia or hypercapnia: Secondary | ICD-10-CM | POA: Diagnosis not present

## 2020-07-05 DIAGNOSIS — D649 Anemia, unspecified: Secondary | ICD-10-CM | POA: Diagnosis not present

## 2020-07-05 DIAGNOSIS — E440 Moderate protein-calorie malnutrition: Secondary | ICD-10-CM | POA: Diagnosis not present

## 2020-07-11 DIAGNOSIS — M160 Bilateral primary osteoarthritis of hip: Secondary | ICD-10-CM | POA: Diagnosis not present

## 2020-07-11 DIAGNOSIS — E1165 Type 2 diabetes mellitus with hyperglycemia: Secondary | ICD-10-CM | POA: Diagnosis not present

## 2020-07-11 DIAGNOSIS — R972 Elevated prostate specific antigen [PSA]: Secondary | ICD-10-CM | POA: Diagnosis not present

## 2020-07-11 DIAGNOSIS — E785 Hyperlipidemia, unspecified: Secondary | ICD-10-CM | POA: Diagnosis not present

## 2020-07-11 DIAGNOSIS — R945 Abnormal results of liver function studies: Secondary | ICD-10-CM | POA: Diagnosis not present

## 2020-07-11 DIAGNOSIS — I10 Essential (primary) hypertension: Secondary | ICD-10-CM | POA: Diagnosis not present

## 2020-07-16 DIAGNOSIS — R339 Retention of urine, unspecified: Secondary | ICD-10-CM | POA: Diagnosis not present

## 2020-07-16 DIAGNOSIS — I110 Hypertensive heart disease with heart failure: Secondary | ICD-10-CM | POA: Diagnosis not present

## 2020-07-16 DIAGNOSIS — E785 Hyperlipidemia, unspecified: Secondary | ICD-10-CM | POA: Diagnosis not present

## 2020-07-17 DIAGNOSIS — G4700 Insomnia, unspecified: Secondary | ICD-10-CM | POA: Diagnosis not present

## 2020-08-13 DIAGNOSIS — R1013 Epigastric pain: Secondary | ICD-10-CM | POA: Diagnosis not present

## 2020-08-13 DIAGNOSIS — T783XXA Angioneurotic edema, initial encounter: Secondary | ICD-10-CM | POA: Diagnosis not present

## 2020-08-13 DIAGNOSIS — K5900 Constipation, unspecified: Secondary | ICD-10-CM | POA: Diagnosis not present

## 2020-08-21 DIAGNOSIS — F5101 Primary insomnia: Secondary | ICD-10-CM | POA: Diagnosis not present

## 2020-08-21 DIAGNOSIS — G4700 Insomnia, unspecified: Secondary | ICD-10-CM | POA: Diagnosis not present

## 2020-09-10 DIAGNOSIS — I5033 Acute on chronic diastolic (congestive) heart failure: Secondary | ICD-10-CM | POA: Diagnosis not present

## 2020-09-10 DIAGNOSIS — I10 Essential (primary) hypertension: Secondary | ICD-10-CM | POA: Diagnosis not present

## 2020-09-10 DIAGNOSIS — Z8546 Personal history of malignant neoplasm of prostate: Secondary | ICD-10-CM | POA: Diagnosis not present

## 2020-09-10 DIAGNOSIS — R278 Other lack of coordination: Secondary | ICD-10-CM | POA: Diagnosis not present

## 2020-09-10 DIAGNOSIS — J9600 Acute respiratory failure, unspecified whether with hypoxia or hypercapnia: Secondary | ICD-10-CM | POA: Diagnosis not present

## 2020-09-10 DIAGNOSIS — Z741 Need for assistance with personal care: Secondary | ICD-10-CM | POA: Diagnosis not present

## 2020-09-10 DIAGNOSIS — E785 Hyperlipidemia, unspecified: Secondary | ICD-10-CM | POA: Diagnosis not present

## 2020-09-10 DIAGNOSIS — E119 Type 2 diabetes mellitus without complications: Secondary | ICD-10-CM | POA: Diagnosis not present

## 2020-09-10 DIAGNOSIS — M6250 Muscle wasting and atrophy, not elsewhere classified, unspecified site: Secondary | ICD-10-CM | POA: Diagnosis not present

## 2020-09-12 DIAGNOSIS — I5033 Acute on chronic diastolic (congestive) heart failure: Secondary | ICD-10-CM | POA: Diagnosis not present

## 2020-09-12 DIAGNOSIS — J9600 Acute respiratory failure, unspecified whether with hypoxia or hypercapnia: Secondary | ICD-10-CM | POA: Diagnosis not present

## 2020-09-12 DIAGNOSIS — M6250 Muscle wasting and atrophy, not elsewhere classified, unspecified site: Secondary | ICD-10-CM | POA: Diagnosis not present

## 2020-09-12 DIAGNOSIS — E785 Hyperlipidemia, unspecified: Secondary | ICD-10-CM | POA: Diagnosis not present

## 2020-09-12 DIAGNOSIS — Z8546 Personal history of malignant neoplasm of prostate: Secondary | ICD-10-CM | POA: Diagnosis not present

## 2020-09-12 DIAGNOSIS — Z741 Need for assistance with personal care: Secondary | ICD-10-CM | POA: Diagnosis not present

## 2020-09-12 DIAGNOSIS — I10 Essential (primary) hypertension: Secondary | ICD-10-CM | POA: Diagnosis not present

## 2020-09-12 DIAGNOSIS — E119 Type 2 diabetes mellitus without complications: Secondary | ICD-10-CM | POA: Diagnosis not present

## 2020-09-12 DIAGNOSIS — R278 Other lack of coordination: Secondary | ICD-10-CM | POA: Diagnosis not present

## 2020-09-13 DIAGNOSIS — M6250 Muscle wasting and atrophy, not elsewhere classified, unspecified site: Secondary | ICD-10-CM | POA: Diagnosis not present

## 2020-09-13 DIAGNOSIS — E785 Hyperlipidemia, unspecified: Secondary | ICD-10-CM | POA: Diagnosis not present

## 2020-09-13 DIAGNOSIS — I10 Essential (primary) hypertension: Secondary | ICD-10-CM | POA: Diagnosis not present

## 2020-09-13 DIAGNOSIS — E119 Type 2 diabetes mellitus without complications: Secondary | ICD-10-CM | POA: Diagnosis not present

## 2020-09-13 DIAGNOSIS — Z741 Need for assistance with personal care: Secondary | ICD-10-CM | POA: Diagnosis not present

## 2020-09-13 DIAGNOSIS — Z8546 Personal history of malignant neoplasm of prostate: Secondary | ICD-10-CM | POA: Diagnosis not present

## 2020-09-13 DIAGNOSIS — I5033 Acute on chronic diastolic (congestive) heart failure: Secondary | ICD-10-CM | POA: Diagnosis not present

## 2020-09-13 DIAGNOSIS — R278 Other lack of coordination: Secondary | ICD-10-CM | POA: Diagnosis not present

## 2020-09-13 DIAGNOSIS — J9600 Acute respiratory failure, unspecified whether with hypoxia or hypercapnia: Secondary | ICD-10-CM | POA: Diagnosis not present

## 2020-09-14 DIAGNOSIS — Z741 Need for assistance with personal care: Secondary | ICD-10-CM | POA: Diagnosis not present

## 2020-09-14 DIAGNOSIS — M6250 Muscle wasting and atrophy, not elsewhere classified, unspecified site: Secondary | ICD-10-CM | POA: Diagnosis not present

## 2020-09-14 DIAGNOSIS — Z8546 Personal history of malignant neoplasm of prostate: Secondary | ICD-10-CM | POA: Diagnosis not present

## 2020-09-14 DIAGNOSIS — E785 Hyperlipidemia, unspecified: Secondary | ICD-10-CM | POA: Diagnosis not present

## 2020-09-14 DIAGNOSIS — J9600 Acute respiratory failure, unspecified whether with hypoxia or hypercapnia: Secondary | ICD-10-CM | POA: Diagnosis not present

## 2020-09-14 DIAGNOSIS — I5033 Acute on chronic diastolic (congestive) heart failure: Secondary | ICD-10-CM | POA: Diagnosis not present

## 2020-09-14 DIAGNOSIS — E119 Type 2 diabetes mellitus without complications: Secondary | ICD-10-CM | POA: Diagnosis not present

## 2020-09-14 DIAGNOSIS — I10 Essential (primary) hypertension: Secondary | ICD-10-CM | POA: Diagnosis not present

## 2020-09-14 DIAGNOSIS — R278 Other lack of coordination: Secondary | ICD-10-CM | POA: Diagnosis not present

## 2020-09-17 DIAGNOSIS — R278 Other lack of coordination: Secondary | ICD-10-CM | POA: Diagnosis not present

## 2020-09-17 DIAGNOSIS — J9600 Acute respiratory failure, unspecified whether with hypoxia or hypercapnia: Secondary | ICD-10-CM | POA: Diagnosis not present

## 2020-09-17 DIAGNOSIS — E119 Type 2 diabetes mellitus without complications: Secondary | ICD-10-CM | POA: Diagnosis not present

## 2020-09-17 DIAGNOSIS — I5033 Acute on chronic diastolic (congestive) heart failure: Secondary | ICD-10-CM | POA: Diagnosis not present

## 2020-09-17 DIAGNOSIS — E785 Hyperlipidemia, unspecified: Secondary | ICD-10-CM | POA: Diagnosis not present

## 2020-09-17 DIAGNOSIS — Z8546 Personal history of malignant neoplasm of prostate: Secondary | ICD-10-CM | POA: Diagnosis not present

## 2020-09-17 DIAGNOSIS — M6250 Muscle wasting and atrophy, not elsewhere classified, unspecified site: Secondary | ICD-10-CM | POA: Diagnosis not present

## 2020-09-17 DIAGNOSIS — Z741 Need for assistance with personal care: Secondary | ICD-10-CM | POA: Diagnosis not present

## 2020-09-17 DIAGNOSIS — I10 Essential (primary) hypertension: Secondary | ICD-10-CM | POA: Diagnosis not present

## 2020-09-18 DIAGNOSIS — E785 Hyperlipidemia, unspecified: Secondary | ICD-10-CM | POA: Diagnosis not present

## 2020-09-18 DIAGNOSIS — M6250 Muscle wasting and atrophy, not elsewhere classified, unspecified site: Secondary | ICD-10-CM | POA: Diagnosis not present

## 2020-09-18 DIAGNOSIS — E119 Type 2 diabetes mellitus without complications: Secondary | ICD-10-CM | POA: Diagnosis not present

## 2020-09-18 DIAGNOSIS — R278 Other lack of coordination: Secondary | ICD-10-CM | POA: Diagnosis not present

## 2020-09-18 DIAGNOSIS — Z8546 Personal history of malignant neoplasm of prostate: Secondary | ICD-10-CM | POA: Diagnosis not present

## 2020-09-18 DIAGNOSIS — Z741 Need for assistance with personal care: Secondary | ICD-10-CM | POA: Diagnosis not present

## 2020-09-18 DIAGNOSIS — J9600 Acute respiratory failure, unspecified whether with hypoxia or hypercapnia: Secondary | ICD-10-CM | POA: Diagnosis not present

## 2020-09-18 DIAGNOSIS — I5033 Acute on chronic diastolic (congestive) heart failure: Secondary | ICD-10-CM | POA: Diagnosis not present

## 2020-09-18 DIAGNOSIS — I10 Essential (primary) hypertension: Secondary | ICD-10-CM | POA: Diagnosis not present

## 2020-09-19 DIAGNOSIS — R278 Other lack of coordination: Secondary | ICD-10-CM | POA: Diagnosis not present

## 2020-09-19 DIAGNOSIS — Z741 Need for assistance with personal care: Secondary | ICD-10-CM | POA: Diagnosis not present

## 2020-09-19 DIAGNOSIS — E785 Hyperlipidemia, unspecified: Secondary | ICD-10-CM | POA: Diagnosis not present

## 2020-09-19 DIAGNOSIS — I5033 Acute on chronic diastolic (congestive) heart failure: Secondary | ICD-10-CM | POA: Diagnosis not present

## 2020-09-19 DIAGNOSIS — Z8546 Personal history of malignant neoplasm of prostate: Secondary | ICD-10-CM | POA: Diagnosis not present

## 2020-09-19 DIAGNOSIS — E119 Type 2 diabetes mellitus without complications: Secondary | ICD-10-CM | POA: Diagnosis not present

## 2020-09-19 DIAGNOSIS — M6250 Muscle wasting and atrophy, not elsewhere classified, unspecified site: Secondary | ICD-10-CM | POA: Diagnosis not present

## 2020-09-19 DIAGNOSIS — J9600 Acute respiratory failure, unspecified whether with hypoxia or hypercapnia: Secondary | ICD-10-CM | POA: Diagnosis not present

## 2020-09-19 DIAGNOSIS — I10 Essential (primary) hypertension: Secondary | ICD-10-CM | POA: Diagnosis not present

## 2020-09-20 DIAGNOSIS — Z741 Need for assistance with personal care: Secondary | ICD-10-CM | POA: Diagnosis not present

## 2020-09-20 DIAGNOSIS — I5033 Acute on chronic diastolic (congestive) heart failure: Secondary | ICD-10-CM | POA: Diagnosis not present

## 2020-09-20 DIAGNOSIS — E785 Hyperlipidemia, unspecified: Secondary | ICD-10-CM | POA: Diagnosis not present

## 2020-09-20 DIAGNOSIS — R278 Other lack of coordination: Secondary | ICD-10-CM | POA: Diagnosis not present

## 2020-09-20 DIAGNOSIS — Z8546 Personal history of malignant neoplasm of prostate: Secondary | ICD-10-CM | POA: Diagnosis not present

## 2020-09-20 DIAGNOSIS — M6250 Muscle wasting and atrophy, not elsewhere classified, unspecified site: Secondary | ICD-10-CM | POA: Diagnosis not present

## 2020-09-20 DIAGNOSIS — I10 Essential (primary) hypertension: Secondary | ICD-10-CM | POA: Diagnosis not present

## 2020-09-20 DIAGNOSIS — E119 Type 2 diabetes mellitus without complications: Secondary | ICD-10-CM | POA: Diagnosis not present

## 2020-09-20 DIAGNOSIS — J9600 Acute respiratory failure, unspecified whether with hypoxia or hypercapnia: Secondary | ICD-10-CM | POA: Diagnosis not present

## 2020-09-21 DIAGNOSIS — E785 Hyperlipidemia, unspecified: Secondary | ICD-10-CM | POA: Diagnosis not present

## 2020-09-21 DIAGNOSIS — J9600 Acute respiratory failure, unspecified whether with hypoxia or hypercapnia: Secondary | ICD-10-CM | POA: Diagnosis not present

## 2020-09-21 DIAGNOSIS — I10 Essential (primary) hypertension: Secondary | ICD-10-CM | POA: Diagnosis not present

## 2020-09-21 DIAGNOSIS — I5033 Acute on chronic diastolic (congestive) heart failure: Secondary | ICD-10-CM | POA: Diagnosis not present

## 2020-09-21 DIAGNOSIS — M6250 Muscle wasting and atrophy, not elsewhere classified, unspecified site: Secondary | ICD-10-CM | POA: Diagnosis not present

## 2020-09-21 DIAGNOSIS — Z741 Need for assistance with personal care: Secondary | ICD-10-CM | POA: Diagnosis not present

## 2020-09-21 DIAGNOSIS — E119 Type 2 diabetes mellitus without complications: Secondary | ICD-10-CM | POA: Diagnosis not present

## 2020-09-21 DIAGNOSIS — Z8546 Personal history of malignant neoplasm of prostate: Secondary | ICD-10-CM | POA: Diagnosis not present

## 2020-09-21 DIAGNOSIS — R278 Other lack of coordination: Secondary | ICD-10-CM | POA: Diagnosis not present

## 2020-09-24 DIAGNOSIS — E785 Hyperlipidemia, unspecified: Secondary | ICD-10-CM | POA: Diagnosis not present

## 2020-09-24 DIAGNOSIS — J9600 Acute respiratory failure, unspecified whether with hypoxia or hypercapnia: Secondary | ICD-10-CM | POA: Diagnosis not present

## 2020-09-24 DIAGNOSIS — R278 Other lack of coordination: Secondary | ICD-10-CM | POA: Diagnosis not present

## 2020-09-24 DIAGNOSIS — Z8546 Personal history of malignant neoplasm of prostate: Secondary | ICD-10-CM | POA: Diagnosis not present

## 2020-09-24 DIAGNOSIS — M6250 Muscle wasting and atrophy, not elsewhere classified, unspecified site: Secondary | ICD-10-CM | POA: Diagnosis not present

## 2020-09-24 DIAGNOSIS — I10 Essential (primary) hypertension: Secondary | ICD-10-CM | POA: Diagnosis not present

## 2020-09-24 DIAGNOSIS — Z741 Need for assistance with personal care: Secondary | ICD-10-CM | POA: Diagnosis not present

## 2020-09-24 DIAGNOSIS — I5033 Acute on chronic diastolic (congestive) heart failure: Secondary | ICD-10-CM | POA: Diagnosis not present

## 2020-09-24 DIAGNOSIS — E119 Type 2 diabetes mellitus without complications: Secondary | ICD-10-CM | POA: Diagnosis not present

## 2020-09-25 DIAGNOSIS — Z741 Need for assistance with personal care: Secondary | ICD-10-CM | POA: Diagnosis not present

## 2020-09-25 DIAGNOSIS — I5033 Acute on chronic diastolic (congestive) heart failure: Secondary | ICD-10-CM | POA: Diagnosis not present

## 2020-09-25 DIAGNOSIS — M6250 Muscle wasting and atrophy, not elsewhere classified, unspecified site: Secondary | ICD-10-CM | POA: Diagnosis not present

## 2020-09-25 DIAGNOSIS — R278 Other lack of coordination: Secondary | ICD-10-CM | POA: Diagnosis not present

## 2020-09-25 DIAGNOSIS — J9600 Acute respiratory failure, unspecified whether with hypoxia or hypercapnia: Secondary | ICD-10-CM | POA: Diagnosis not present

## 2020-09-25 DIAGNOSIS — E119 Type 2 diabetes mellitus without complications: Secondary | ICD-10-CM | POA: Diagnosis not present

## 2020-09-25 DIAGNOSIS — I10 Essential (primary) hypertension: Secondary | ICD-10-CM | POA: Diagnosis not present

## 2020-09-25 DIAGNOSIS — E785 Hyperlipidemia, unspecified: Secondary | ICD-10-CM | POA: Diagnosis not present

## 2020-09-25 DIAGNOSIS — Z8546 Personal history of malignant neoplasm of prostate: Secondary | ICD-10-CM | POA: Diagnosis not present

## 2020-09-26 DIAGNOSIS — I10 Essential (primary) hypertension: Secondary | ICD-10-CM | POA: Diagnosis not present

## 2020-09-26 DIAGNOSIS — R278 Other lack of coordination: Secondary | ICD-10-CM | POA: Diagnosis not present

## 2020-09-26 DIAGNOSIS — I5033 Acute on chronic diastolic (congestive) heart failure: Secondary | ICD-10-CM | POA: Diagnosis not present

## 2020-09-26 DIAGNOSIS — E785 Hyperlipidemia, unspecified: Secondary | ICD-10-CM | POA: Diagnosis not present

## 2020-09-26 DIAGNOSIS — Z741 Need for assistance with personal care: Secondary | ICD-10-CM | POA: Diagnosis not present

## 2020-09-26 DIAGNOSIS — Z8546 Personal history of malignant neoplasm of prostate: Secondary | ICD-10-CM | POA: Diagnosis not present

## 2020-09-26 DIAGNOSIS — M6250 Muscle wasting and atrophy, not elsewhere classified, unspecified site: Secondary | ICD-10-CM | POA: Diagnosis not present

## 2020-09-26 DIAGNOSIS — J9600 Acute respiratory failure, unspecified whether with hypoxia or hypercapnia: Secondary | ICD-10-CM | POA: Diagnosis not present

## 2020-09-26 DIAGNOSIS — E119 Type 2 diabetes mellitus without complications: Secondary | ICD-10-CM | POA: Diagnosis not present

## 2020-09-27 DIAGNOSIS — F4329 Adjustment disorder with other symptoms: Secondary | ICD-10-CM | POA: Diagnosis not present

## 2020-09-27 DIAGNOSIS — F5101 Primary insomnia: Secondary | ICD-10-CM | POA: Diagnosis not present

## 2020-10-09 DIAGNOSIS — F4329 Adjustment disorder with other symptoms: Secondary | ICD-10-CM | POA: Diagnosis not present

## 2020-10-09 DIAGNOSIS — F5101 Primary insomnia: Secondary | ICD-10-CM | POA: Diagnosis not present

## 2020-10-16 DIAGNOSIS — I10 Essential (primary) hypertension: Secondary | ICD-10-CM | POA: Diagnosis not present

## 2020-10-16 DIAGNOSIS — E119 Type 2 diabetes mellitus without complications: Secondary | ICD-10-CM | POA: Diagnosis not present

## 2020-10-16 DIAGNOSIS — D649 Anemia, unspecified: Secondary | ICD-10-CM | POA: Diagnosis not present

## 2020-10-16 DIAGNOSIS — E785 Hyperlipidemia, unspecified: Secondary | ICD-10-CM | POA: Diagnosis not present

## 2020-10-16 DIAGNOSIS — I5032 Chronic diastolic (congestive) heart failure: Secondary | ICD-10-CM | POA: Diagnosis not present

## 2020-10-17 DIAGNOSIS — D519 Vitamin B12 deficiency anemia, unspecified: Secondary | ICD-10-CM | POA: Diagnosis not present

## 2020-10-17 DIAGNOSIS — I5033 Acute on chronic diastolic (congestive) heart failure: Secondary | ICD-10-CM | POA: Diagnosis not present

## 2020-10-17 DIAGNOSIS — E119 Type 2 diabetes mellitus without complications: Secondary | ICD-10-CM | POA: Diagnosis not present

## 2020-10-19 DIAGNOSIS — R748 Abnormal levels of other serum enzymes: Secondary | ICD-10-CM | POA: Diagnosis not present

## 2020-10-19 DIAGNOSIS — E119 Type 2 diabetes mellitus without complications: Secondary | ICD-10-CM | POA: Diagnosis not present

## 2020-10-19 DIAGNOSIS — D649 Anemia, unspecified: Secondary | ICD-10-CM | POA: Diagnosis not present

## 2020-10-19 DIAGNOSIS — E785 Hyperlipidemia, unspecified: Secondary | ICD-10-CM | POA: Diagnosis not present

## 2020-10-23 DIAGNOSIS — F5101 Primary insomnia: Secondary | ICD-10-CM | POA: Diagnosis not present

## 2020-10-23 DIAGNOSIS — F4329 Adjustment disorder with other symptoms: Secondary | ICD-10-CM | POA: Diagnosis not present

## 2020-11-08 DIAGNOSIS — F5101 Primary insomnia: Secondary | ICD-10-CM | POA: Diagnosis not present

## 2020-11-08 DIAGNOSIS — F4329 Adjustment disorder with other symptoms: Secondary | ICD-10-CM | POA: Diagnosis not present

## 2020-11-20 DIAGNOSIS — E559 Vitamin D deficiency, unspecified: Secondary | ICD-10-CM | POA: Diagnosis not present

## 2020-11-20 DIAGNOSIS — F5101 Primary insomnia: Secondary | ICD-10-CM | POA: Diagnosis not present

## 2020-11-20 DIAGNOSIS — D649 Anemia, unspecified: Secondary | ICD-10-CM | POA: Diagnosis not present

## 2020-11-20 DIAGNOSIS — E119 Type 2 diabetes mellitus without complications: Secondary | ICD-10-CM | POA: Diagnosis not present

## 2020-12-03 DIAGNOSIS — M24571 Contracture, right ankle: Secondary | ICD-10-CM | POA: Diagnosis not present

## 2020-12-03 DIAGNOSIS — L602 Onychogryphosis: Secondary | ICD-10-CM | POA: Diagnosis not present

## 2020-12-03 DIAGNOSIS — M24572 Contracture, left ankle: Secondary | ICD-10-CM | POA: Diagnosis not present

## 2020-12-03 DIAGNOSIS — I739 Peripheral vascular disease, unspecified: Secondary | ICD-10-CM | POA: Diagnosis not present

## 2020-12-10 DIAGNOSIS — E785 Hyperlipidemia, unspecified: Secondary | ICD-10-CM | POA: Diagnosis not present

## 2020-12-10 DIAGNOSIS — I10 Essential (primary) hypertension: Secondary | ICD-10-CM | POA: Diagnosis not present

## 2020-12-10 DIAGNOSIS — R52 Pain, unspecified: Secondary | ICD-10-CM | POA: Diagnosis not present

## 2020-12-28 DIAGNOSIS — N390 Urinary tract infection, site not specified: Secondary | ICD-10-CM | POA: Diagnosis not present

## 2020-12-28 DIAGNOSIS — R339 Retention of urine, unspecified: Secondary | ICD-10-CM | POA: Diagnosis not present

## 2020-12-28 DIAGNOSIS — R300 Dysuria: Secondary | ICD-10-CM | POA: Diagnosis not present

## 2021-01-01 DIAGNOSIS — R339 Retention of urine, unspecified: Secondary | ICD-10-CM | POA: Diagnosis not present

## 2021-01-01 DIAGNOSIS — R300 Dysuria: Secondary | ICD-10-CM | POA: Diagnosis not present

## 2021-01-15 DIAGNOSIS — F5101 Primary insomnia: Secondary | ICD-10-CM | POA: Diagnosis not present

## 2021-01-30 DIAGNOSIS — Z23 Encounter for immunization: Secondary | ICD-10-CM | POA: Diagnosis not present

## 2021-02-08 DIAGNOSIS — E785 Hyperlipidemia, unspecified: Secondary | ICD-10-CM | POA: Diagnosis not present

## 2021-02-08 DIAGNOSIS — I10 Essential (primary) hypertension: Secondary | ICD-10-CM | POA: Diagnosis not present

## 2021-02-08 DIAGNOSIS — I5032 Chronic diastolic (congestive) heart failure: Secondary | ICD-10-CM | POA: Diagnosis not present

## 2021-02-08 DIAGNOSIS — E119 Type 2 diabetes mellitus without complications: Secondary | ICD-10-CM | POA: Diagnosis not present

## 2021-02-11 DIAGNOSIS — E119 Type 2 diabetes mellitus without complications: Secondary | ICD-10-CM | POA: Diagnosis not present

## 2021-02-11 DIAGNOSIS — E559 Vitamin D deficiency, unspecified: Secondary | ICD-10-CM | POA: Diagnosis not present

## 2021-02-11 DIAGNOSIS — I10 Essential (primary) hypertension: Secondary | ICD-10-CM | POA: Diagnosis not present

## 2021-02-11 DIAGNOSIS — E785 Hyperlipidemia, unspecified: Secondary | ICD-10-CM | POA: Diagnosis not present

## 2021-02-12 DIAGNOSIS — S31000A Unspecified open wound of lower back and pelvis without penetration into retroperitoneum, initial encounter: Secondary | ICD-10-CM | POA: Diagnosis not present

## 2021-02-12 DIAGNOSIS — L89153 Pressure ulcer of sacral region, stage 3: Secondary | ICD-10-CM | POA: Diagnosis not present

## 2021-02-12 DIAGNOSIS — E119 Type 2 diabetes mellitus without complications: Secondary | ICD-10-CM | POA: Diagnosis not present

## 2021-02-12 DIAGNOSIS — D649 Anemia, unspecified: Secondary | ICD-10-CM | POA: Diagnosis not present

## 2021-02-12 DIAGNOSIS — E559 Vitamin D deficiency, unspecified: Secondary | ICD-10-CM | POA: Diagnosis not present

## 2021-02-19 DIAGNOSIS — L89153 Pressure ulcer of sacral region, stage 3: Secondary | ICD-10-CM | POA: Diagnosis not present

## 2021-03-19 DIAGNOSIS — I5033 Acute on chronic diastolic (congestive) heart failure: Secondary | ICD-10-CM | POA: Diagnosis not present

## 2021-03-19 DIAGNOSIS — Z20822 Contact with and (suspected) exposure to covid-19: Secondary | ICD-10-CM | POA: Diagnosis not present

## 2021-03-22 DIAGNOSIS — Z20822 Contact with and (suspected) exposure to covid-19: Secondary | ICD-10-CM | POA: Diagnosis not present

## 2021-03-22 DIAGNOSIS — I5033 Acute on chronic diastolic (congestive) heart failure: Secondary | ICD-10-CM | POA: Diagnosis not present

## 2021-03-25 DIAGNOSIS — Z20822 Contact with and (suspected) exposure to covid-19: Secondary | ICD-10-CM | POA: Diagnosis not present

## 2021-03-25 DIAGNOSIS — I5033 Acute on chronic diastolic (congestive) heart failure: Secondary | ICD-10-CM | POA: Diagnosis not present

## 2021-04-01 DIAGNOSIS — E559 Vitamin D deficiency, unspecified: Secondary | ICD-10-CM | POA: Diagnosis not present

## 2021-04-01 DIAGNOSIS — D649 Anemia, unspecified: Secondary | ICD-10-CM | POA: Diagnosis not present

## 2021-04-01 DIAGNOSIS — K5900 Constipation, unspecified: Secondary | ICD-10-CM | POA: Diagnosis not present

## 2021-04-01 DIAGNOSIS — E785 Hyperlipidemia, unspecified: Secondary | ICD-10-CM | POA: Diagnosis not present

## 2021-04-01 DIAGNOSIS — I5032 Chronic diastolic (congestive) heart failure: Secondary | ICD-10-CM | POA: Diagnosis not present

## 2021-04-01 DIAGNOSIS — E119 Type 2 diabetes mellitus without complications: Secondary | ICD-10-CM | POA: Diagnosis not present

## 2021-04-01 DIAGNOSIS — I10 Essential (primary) hypertension: Secondary | ICD-10-CM | POA: Diagnosis not present

## 2021-04-16 DIAGNOSIS — E119 Type 2 diabetes mellitus without complications: Secondary | ICD-10-CM | POA: Diagnosis not present

## 2021-04-16 DIAGNOSIS — R293 Abnormal posture: Secondary | ICD-10-CM | POA: Diagnosis not present

## 2021-04-17 DIAGNOSIS — R293 Abnormal posture: Secondary | ICD-10-CM | POA: Diagnosis not present

## 2021-04-17 DIAGNOSIS — E119 Type 2 diabetes mellitus without complications: Secondary | ICD-10-CM | POA: Diagnosis not present

## 2021-04-23 DIAGNOSIS — E119 Type 2 diabetes mellitus without complications: Secondary | ICD-10-CM | POA: Diagnosis not present

## 2021-04-23 DIAGNOSIS — R293 Abnormal posture: Secondary | ICD-10-CM | POA: Diagnosis not present

## 2021-04-25 DIAGNOSIS — R293 Abnormal posture: Secondary | ICD-10-CM | POA: Diagnosis not present

## 2021-04-25 DIAGNOSIS — E119 Type 2 diabetes mellitus without complications: Secondary | ICD-10-CM | POA: Diagnosis not present

## 2021-04-29 DIAGNOSIS — R293 Abnormal posture: Secondary | ICD-10-CM | POA: Diagnosis not present

## 2021-04-29 DIAGNOSIS — E119 Type 2 diabetes mellitus without complications: Secondary | ICD-10-CM | POA: Diagnosis not present

## 2021-04-30 DIAGNOSIS — F5101 Primary insomnia: Secondary | ICD-10-CM | POA: Diagnosis not present

## 2021-05-01 DIAGNOSIS — E119 Type 2 diabetes mellitus without complications: Secondary | ICD-10-CM | POA: Diagnosis not present

## 2021-05-01 DIAGNOSIS — R293 Abnormal posture: Secondary | ICD-10-CM | POA: Diagnosis not present

## 2021-05-15 DIAGNOSIS — M6250 Muscle wasting and atrophy, not elsewhere classified, unspecified site: Secondary | ICD-10-CM | POA: Diagnosis not present

## 2021-05-15 DIAGNOSIS — E119 Type 2 diabetes mellitus without complications: Secondary | ICD-10-CM | POA: Diagnosis not present

## 2021-05-15 DIAGNOSIS — I5033 Acute on chronic diastolic (congestive) heart failure: Secondary | ICD-10-CM | POA: Diagnosis not present

## 2021-05-15 DIAGNOSIS — J9600 Acute respiratory failure, unspecified whether with hypoxia or hypercapnia: Secondary | ICD-10-CM | POA: Diagnosis not present

## 2021-05-15 DIAGNOSIS — I5032 Chronic diastolic (congestive) heart failure: Secondary | ICD-10-CM | POA: Diagnosis not present

## 2021-05-15 DIAGNOSIS — R278 Other lack of coordination: Secondary | ICD-10-CM | POA: Diagnosis not present

## 2021-05-20 DIAGNOSIS — I5032 Chronic diastolic (congestive) heart failure: Secondary | ICD-10-CM | POA: Diagnosis not present

## 2021-05-20 DIAGNOSIS — M6250 Muscle wasting and atrophy, not elsewhere classified, unspecified site: Secondary | ICD-10-CM | POA: Diagnosis not present

## 2021-05-20 DIAGNOSIS — I5033 Acute on chronic diastolic (congestive) heart failure: Secondary | ICD-10-CM | POA: Diagnosis not present

## 2021-05-20 DIAGNOSIS — R278 Other lack of coordination: Secondary | ICD-10-CM | POA: Diagnosis not present

## 2021-05-20 DIAGNOSIS — E119 Type 2 diabetes mellitus without complications: Secondary | ICD-10-CM | POA: Diagnosis not present

## 2021-05-20 DIAGNOSIS — J9600 Acute respiratory failure, unspecified whether with hypoxia or hypercapnia: Secondary | ICD-10-CM | POA: Diagnosis not present

## 2021-05-21 DIAGNOSIS — I5033 Acute on chronic diastolic (congestive) heart failure: Secondary | ICD-10-CM | POA: Diagnosis not present

## 2021-05-21 DIAGNOSIS — E119 Type 2 diabetes mellitus without complications: Secondary | ICD-10-CM | POA: Diagnosis not present

## 2021-05-21 DIAGNOSIS — M6250 Muscle wasting and atrophy, not elsewhere classified, unspecified site: Secondary | ICD-10-CM | POA: Diagnosis not present

## 2021-05-21 DIAGNOSIS — R278 Other lack of coordination: Secondary | ICD-10-CM | POA: Diagnosis not present

## 2021-05-21 DIAGNOSIS — I5032 Chronic diastolic (congestive) heart failure: Secondary | ICD-10-CM | POA: Diagnosis not present

## 2021-05-21 DIAGNOSIS — J9600 Acute respiratory failure, unspecified whether with hypoxia or hypercapnia: Secondary | ICD-10-CM | POA: Diagnosis not present

## 2021-05-22 DIAGNOSIS — E119 Type 2 diabetes mellitus without complications: Secondary | ICD-10-CM | POA: Diagnosis not present

## 2021-05-22 DIAGNOSIS — J9600 Acute respiratory failure, unspecified whether with hypoxia or hypercapnia: Secondary | ICD-10-CM | POA: Diagnosis not present

## 2021-05-22 DIAGNOSIS — M6250 Muscle wasting and atrophy, not elsewhere classified, unspecified site: Secondary | ICD-10-CM | POA: Diagnosis not present

## 2021-05-22 DIAGNOSIS — I5032 Chronic diastolic (congestive) heart failure: Secondary | ICD-10-CM | POA: Diagnosis not present

## 2021-05-22 DIAGNOSIS — I5033 Acute on chronic diastolic (congestive) heart failure: Secondary | ICD-10-CM | POA: Diagnosis not present

## 2021-05-22 DIAGNOSIS — R278 Other lack of coordination: Secondary | ICD-10-CM | POA: Diagnosis not present

## 2021-05-23 DIAGNOSIS — J9600 Acute respiratory failure, unspecified whether with hypoxia or hypercapnia: Secondary | ICD-10-CM | POA: Diagnosis not present

## 2021-05-23 DIAGNOSIS — I5032 Chronic diastolic (congestive) heart failure: Secondary | ICD-10-CM | POA: Diagnosis not present

## 2021-05-23 DIAGNOSIS — M6250 Muscle wasting and atrophy, not elsewhere classified, unspecified site: Secondary | ICD-10-CM | POA: Diagnosis not present

## 2021-05-23 DIAGNOSIS — R278 Other lack of coordination: Secondary | ICD-10-CM | POA: Diagnosis not present

## 2021-05-23 DIAGNOSIS — I5033 Acute on chronic diastolic (congestive) heart failure: Secondary | ICD-10-CM | POA: Diagnosis not present

## 2021-05-23 DIAGNOSIS — E119 Type 2 diabetes mellitus without complications: Secondary | ICD-10-CM | POA: Diagnosis not present

## 2021-05-24 DIAGNOSIS — I5032 Chronic diastolic (congestive) heart failure: Secondary | ICD-10-CM | POA: Diagnosis not present

## 2021-05-24 DIAGNOSIS — E119 Type 2 diabetes mellitus without complications: Secondary | ICD-10-CM | POA: Diagnosis not present

## 2021-05-24 DIAGNOSIS — R278 Other lack of coordination: Secondary | ICD-10-CM | POA: Diagnosis not present

## 2021-05-24 DIAGNOSIS — J9600 Acute respiratory failure, unspecified whether with hypoxia or hypercapnia: Secondary | ICD-10-CM | POA: Diagnosis not present

## 2021-05-24 DIAGNOSIS — M6250 Muscle wasting and atrophy, not elsewhere classified, unspecified site: Secondary | ICD-10-CM | POA: Diagnosis not present

## 2021-05-24 DIAGNOSIS — I5033 Acute on chronic diastolic (congestive) heart failure: Secondary | ICD-10-CM | POA: Diagnosis not present

## 2021-05-29 DIAGNOSIS — I5033 Acute on chronic diastolic (congestive) heart failure: Secondary | ICD-10-CM | POA: Diagnosis not present

## 2021-05-29 DIAGNOSIS — R278 Other lack of coordination: Secondary | ICD-10-CM | POA: Diagnosis not present

## 2021-05-29 DIAGNOSIS — I5032 Chronic diastolic (congestive) heart failure: Secondary | ICD-10-CM | POA: Diagnosis not present

## 2021-05-29 DIAGNOSIS — E119 Type 2 diabetes mellitus without complications: Secondary | ICD-10-CM | POA: Diagnosis not present

## 2021-05-29 DIAGNOSIS — M6250 Muscle wasting and atrophy, not elsewhere classified, unspecified site: Secondary | ICD-10-CM | POA: Diagnosis not present

## 2021-05-29 DIAGNOSIS — J9600 Acute respiratory failure, unspecified whether with hypoxia or hypercapnia: Secondary | ICD-10-CM | POA: Diagnosis not present

## 2021-05-30 DIAGNOSIS — M6250 Muscle wasting and atrophy, not elsewhere classified, unspecified site: Secondary | ICD-10-CM | POA: Diagnosis not present

## 2021-05-30 DIAGNOSIS — R278 Other lack of coordination: Secondary | ICD-10-CM | POA: Diagnosis not present

## 2021-05-30 DIAGNOSIS — E119 Type 2 diabetes mellitus without complications: Secondary | ICD-10-CM | POA: Diagnosis not present

## 2021-05-30 DIAGNOSIS — I5033 Acute on chronic diastolic (congestive) heart failure: Secondary | ICD-10-CM | POA: Diagnosis not present

## 2021-05-30 DIAGNOSIS — J9600 Acute respiratory failure, unspecified whether with hypoxia or hypercapnia: Secondary | ICD-10-CM | POA: Diagnosis not present

## 2021-05-30 DIAGNOSIS — I5032 Chronic diastolic (congestive) heart failure: Secondary | ICD-10-CM | POA: Diagnosis not present

## 2021-06-04 DIAGNOSIS — I10 Essential (primary) hypertension: Secondary | ICD-10-CM | POA: Diagnosis not present

## 2021-06-04 DIAGNOSIS — M6250 Muscle wasting and atrophy, not elsewhere classified, unspecified site: Secondary | ICD-10-CM | POA: Diagnosis not present

## 2021-06-04 DIAGNOSIS — J9600 Acute respiratory failure, unspecified whether with hypoxia or hypercapnia: Secondary | ICD-10-CM | POA: Diagnosis not present

## 2021-06-04 DIAGNOSIS — I5032 Chronic diastolic (congestive) heart failure: Secondary | ICD-10-CM | POA: Diagnosis not present

## 2021-06-04 DIAGNOSIS — E119 Type 2 diabetes mellitus without complications: Secondary | ICD-10-CM | POA: Diagnosis not present

## 2021-06-04 DIAGNOSIS — D649 Anemia, unspecified: Secondary | ICD-10-CM | POA: Diagnosis not present

## 2021-06-04 DIAGNOSIS — R278 Other lack of coordination: Secondary | ICD-10-CM | POA: Diagnosis not present

## 2021-06-04 DIAGNOSIS — I5033 Acute on chronic diastolic (congestive) heart failure: Secondary | ICD-10-CM | POA: Diagnosis not present

## 2021-06-05 DIAGNOSIS — I5033 Acute on chronic diastolic (congestive) heart failure: Secondary | ICD-10-CM | POA: Diagnosis not present

## 2021-06-05 DIAGNOSIS — M6250 Muscle wasting and atrophy, not elsewhere classified, unspecified site: Secondary | ICD-10-CM | POA: Diagnosis not present

## 2021-06-05 DIAGNOSIS — R278 Other lack of coordination: Secondary | ICD-10-CM | POA: Diagnosis not present

## 2021-06-05 DIAGNOSIS — I5032 Chronic diastolic (congestive) heart failure: Secondary | ICD-10-CM | POA: Diagnosis not present

## 2021-06-05 DIAGNOSIS — E119 Type 2 diabetes mellitus without complications: Secondary | ICD-10-CM | POA: Diagnosis not present

## 2021-06-05 DIAGNOSIS — J9600 Acute respiratory failure, unspecified whether with hypoxia or hypercapnia: Secondary | ICD-10-CM | POA: Diagnosis not present

## 2021-07-04 DIAGNOSIS — K219 Gastro-esophageal reflux disease without esophagitis: Secondary | ICD-10-CM | POA: Diagnosis not present

## 2021-07-08 DIAGNOSIS — M2141 Flat foot [pes planus] (acquired), right foot: Secondary | ICD-10-CM | POA: Diagnosis not present

## 2021-07-08 DIAGNOSIS — M2142 Flat foot [pes planus] (acquired), left foot: Secondary | ICD-10-CM | POA: Diagnosis not present

## 2021-07-08 DIAGNOSIS — I739 Peripheral vascular disease, unspecified: Secondary | ICD-10-CM | POA: Diagnosis not present

## 2021-07-08 DIAGNOSIS — B351 Tinea unguium: Secondary | ICD-10-CM | POA: Diagnosis not present

## 2021-07-29 DIAGNOSIS — R339 Retention of urine, unspecified: Secondary | ICD-10-CM | POA: Diagnosis not present

## 2021-07-29 DIAGNOSIS — E785 Hyperlipidemia, unspecified: Secondary | ICD-10-CM | POA: Diagnosis not present

## 2021-07-29 DIAGNOSIS — E559 Vitamin D deficiency, unspecified: Secondary | ICD-10-CM | POA: Diagnosis not present

## 2021-07-29 DIAGNOSIS — K5900 Constipation, unspecified: Secondary | ICD-10-CM | POA: Diagnosis not present

## 2021-07-29 DIAGNOSIS — I10 Essential (primary) hypertension: Secondary | ICD-10-CM | POA: Diagnosis not present

## 2021-08-20 DIAGNOSIS — F5101 Primary insomnia: Secondary | ICD-10-CM | POA: Diagnosis not present

## 2021-09-26 DIAGNOSIS — K219 Gastro-esophageal reflux disease without esophagitis: Secondary | ICD-10-CM | POA: Diagnosis not present

## 2021-09-26 DIAGNOSIS — E119 Type 2 diabetes mellitus without complications: Secondary | ICD-10-CM | POA: Diagnosis not present

## 2021-09-26 DIAGNOSIS — I5032 Chronic diastolic (congestive) heart failure: Secondary | ICD-10-CM | POA: Diagnosis not present

## 2021-09-26 DIAGNOSIS — E785 Hyperlipidemia, unspecified: Secondary | ICD-10-CM | POA: Diagnosis not present

## 2021-10-21 DIAGNOSIS — M6250 Muscle wasting and atrophy, not elsewhere classified, unspecified site: Secondary | ICD-10-CM | POA: Diagnosis not present

## 2021-10-21 DIAGNOSIS — R293 Abnormal posture: Secondary | ICD-10-CM | POA: Diagnosis not present

## 2021-10-21 DIAGNOSIS — R278 Other lack of coordination: Secondary | ICD-10-CM | POA: Diagnosis not present

## 2021-10-21 DIAGNOSIS — I110 Hypertensive heart disease with heart failure: Secondary | ICD-10-CM | POA: Diagnosis not present

## 2021-10-21 DIAGNOSIS — I5032 Chronic diastolic (congestive) heart failure: Secondary | ICD-10-CM | POA: Diagnosis not present

## 2021-10-21 DIAGNOSIS — M62838 Other muscle spasm: Secondary | ICD-10-CM | POA: Diagnosis not present

## 2021-10-21 DIAGNOSIS — E119 Type 2 diabetes mellitus without complications: Secondary | ICD-10-CM | POA: Diagnosis not present

## 2021-10-24 DIAGNOSIS — I5032 Chronic diastolic (congestive) heart failure: Secondary | ICD-10-CM | POA: Diagnosis not present

## 2021-10-24 DIAGNOSIS — M62838 Other muscle spasm: Secondary | ICD-10-CM | POA: Diagnosis not present

## 2021-10-24 DIAGNOSIS — M6250 Muscle wasting and atrophy, not elsewhere classified, unspecified site: Secondary | ICD-10-CM | POA: Diagnosis not present

## 2021-10-24 DIAGNOSIS — E119 Type 2 diabetes mellitus without complications: Secondary | ICD-10-CM | POA: Diagnosis not present

## 2021-10-24 DIAGNOSIS — R293 Abnormal posture: Secondary | ICD-10-CM | POA: Diagnosis not present

## 2021-10-24 DIAGNOSIS — I110 Hypertensive heart disease with heart failure: Secondary | ICD-10-CM | POA: Diagnosis not present

## 2021-10-24 DIAGNOSIS — R278 Other lack of coordination: Secondary | ICD-10-CM | POA: Diagnosis not present

## 2021-10-25 DIAGNOSIS — I110 Hypertensive heart disease with heart failure: Secondary | ICD-10-CM | POA: Diagnosis not present

## 2021-10-25 DIAGNOSIS — I5032 Chronic diastolic (congestive) heart failure: Secondary | ICD-10-CM | POA: Diagnosis not present

## 2021-10-25 DIAGNOSIS — R293 Abnormal posture: Secondary | ICD-10-CM | POA: Diagnosis not present

## 2021-10-25 DIAGNOSIS — M62838 Other muscle spasm: Secondary | ICD-10-CM | POA: Diagnosis not present

## 2021-10-25 DIAGNOSIS — E119 Type 2 diabetes mellitus without complications: Secondary | ICD-10-CM | POA: Diagnosis not present

## 2021-10-25 DIAGNOSIS — Z23 Encounter for immunization: Secondary | ICD-10-CM | POA: Diagnosis not present

## 2021-10-25 DIAGNOSIS — R278 Other lack of coordination: Secondary | ICD-10-CM | POA: Diagnosis not present

## 2021-10-25 DIAGNOSIS — M6250 Muscle wasting and atrophy, not elsewhere classified, unspecified site: Secondary | ICD-10-CM | POA: Diagnosis not present

## 2021-10-30 DIAGNOSIS — I5032 Chronic diastolic (congestive) heart failure: Secondary | ICD-10-CM | POA: Diagnosis not present

## 2021-10-30 DIAGNOSIS — R293 Abnormal posture: Secondary | ICD-10-CM | POA: Diagnosis not present

## 2021-10-30 DIAGNOSIS — R278 Other lack of coordination: Secondary | ICD-10-CM | POA: Diagnosis not present

## 2021-10-30 DIAGNOSIS — I110 Hypertensive heart disease with heart failure: Secondary | ICD-10-CM | POA: Diagnosis not present

## 2021-10-30 DIAGNOSIS — E119 Type 2 diabetes mellitus without complications: Secondary | ICD-10-CM | POA: Diagnosis not present

## 2021-10-30 DIAGNOSIS — M62838 Other muscle spasm: Secondary | ICD-10-CM | POA: Diagnosis not present

## 2021-10-30 DIAGNOSIS — M6250 Muscle wasting and atrophy, not elsewhere classified, unspecified site: Secondary | ICD-10-CM | POA: Diagnosis not present

## 2021-10-31 DIAGNOSIS — M62838 Other muscle spasm: Secondary | ICD-10-CM | POA: Diagnosis not present

## 2021-10-31 DIAGNOSIS — I110 Hypertensive heart disease with heart failure: Secondary | ICD-10-CM | POA: Diagnosis not present

## 2021-10-31 DIAGNOSIS — I5032 Chronic diastolic (congestive) heart failure: Secondary | ICD-10-CM | POA: Diagnosis not present

## 2021-10-31 DIAGNOSIS — R293 Abnormal posture: Secondary | ICD-10-CM | POA: Diagnosis not present

## 2021-10-31 DIAGNOSIS — M6250 Muscle wasting and atrophy, not elsewhere classified, unspecified site: Secondary | ICD-10-CM | POA: Diagnosis not present

## 2021-10-31 DIAGNOSIS — R278 Other lack of coordination: Secondary | ICD-10-CM | POA: Diagnosis not present

## 2021-10-31 DIAGNOSIS — E119 Type 2 diabetes mellitus without complications: Secondary | ICD-10-CM | POA: Diagnosis not present

## 2021-11-01 DIAGNOSIS — R278 Other lack of coordination: Secondary | ICD-10-CM | POA: Diagnosis not present

## 2021-11-01 DIAGNOSIS — M62838 Other muscle spasm: Secondary | ICD-10-CM | POA: Diagnosis not present

## 2021-11-01 DIAGNOSIS — I5032 Chronic diastolic (congestive) heart failure: Secondary | ICD-10-CM | POA: Diagnosis not present

## 2021-11-01 DIAGNOSIS — E119 Type 2 diabetes mellitus without complications: Secondary | ICD-10-CM | POA: Diagnosis not present

## 2021-11-01 DIAGNOSIS — I110 Hypertensive heart disease with heart failure: Secondary | ICD-10-CM | POA: Diagnosis not present

## 2021-11-01 DIAGNOSIS — M6250 Muscle wasting and atrophy, not elsewhere classified, unspecified site: Secondary | ICD-10-CM | POA: Diagnosis not present

## 2021-11-01 DIAGNOSIS — R293 Abnormal posture: Secondary | ICD-10-CM | POA: Diagnosis not present

## 2021-11-04 DIAGNOSIS — R278 Other lack of coordination: Secondary | ICD-10-CM | POA: Diagnosis not present

## 2021-11-04 DIAGNOSIS — R293 Abnormal posture: Secondary | ICD-10-CM | POA: Diagnosis not present

## 2021-11-04 DIAGNOSIS — I5032 Chronic diastolic (congestive) heart failure: Secondary | ICD-10-CM | POA: Diagnosis not present

## 2021-11-04 DIAGNOSIS — M62838 Other muscle spasm: Secondary | ICD-10-CM | POA: Diagnosis not present

## 2021-11-04 DIAGNOSIS — M6250 Muscle wasting and atrophy, not elsewhere classified, unspecified site: Secondary | ICD-10-CM | POA: Diagnosis not present

## 2021-11-04 DIAGNOSIS — I110 Hypertensive heart disease with heart failure: Secondary | ICD-10-CM | POA: Diagnosis not present

## 2021-11-04 DIAGNOSIS — E119 Type 2 diabetes mellitus without complications: Secondary | ICD-10-CM | POA: Diagnosis not present

## 2021-11-05 DIAGNOSIS — E119 Type 2 diabetes mellitus without complications: Secondary | ICD-10-CM | POA: Diagnosis not present

## 2021-11-05 DIAGNOSIS — M6250 Muscle wasting and atrophy, not elsewhere classified, unspecified site: Secondary | ICD-10-CM | POA: Diagnosis not present

## 2021-11-05 DIAGNOSIS — I110 Hypertensive heart disease with heart failure: Secondary | ICD-10-CM | POA: Diagnosis not present

## 2021-11-05 DIAGNOSIS — M62838 Other muscle spasm: Secondary | ICD-10-CM | POA: Diagnosis not present

## 2021-11-05 DIAGNOSIS — R278 Other lack of coordination: Secondary | ICD-10-CM | POA: Diagnosis not present

## 2021-11-05 DIAGNOSIS — R293 Abnormal posture: Secondary | ICD-10-CM | POA: Diagnosis not present

## 2021-11-05 DIAGNOSIS — L304 Erythema intertrigo: Secondary | ICD-10-CM | POA: Diagnosis not present

## 2021-11-05 DIAGNOSIS — I5032 Chronic diastolic (congestive) heart failure: Secondary | ICD-10-CM | POA: Diagnosis not present

## 2021-11-28 DIAGNOSIS — I5032 Chronic diastolic (congestive) heart failure: Secondary | ICD-10-CM | POA: Diagnosis not present

## 2021-11-28 DIAGNOSIS — E119 Type 2 diabetes mellitus without complications: Secondary | ICD-10-CM | POA: Diagnosis not present

## 2021-11-28 DIAGNOSIS — D649 Anemia, unspecified: Secondary | ICD-10-CM | POA: Diagnosis not present

## 2021-11-28 DIAGNOSIS — I10 Essential (primary) hypertension: Secondary | ICD-10-CM | POA: Diagnosis not present

## 2021-11-28 DIAGNOSIS — E785 Hyperlipidemia, unspecified: Secondary | ICD-10-CM | POA: Diagnosis not present

## 2021-11-29 DIAGNOSIS — I739 Peripheral vascular disease, unspecified: Secondary | ICD-10-CM | POA: Diagnosis not present

## 2021-11-29 DIAGNOSIS — B351 Tinea unguium: Secondary | ICD-10-CM | POA: Diagnosis not present

## 2021-12-02 DIAGNOSIS — E119 Type 2 diabetes mellitus without complications: Secondary | ICD-10-CM | POA: Diagnosis not present

## 2021-12-02 DIAGNOSIS — Z79899 Other long term (current) drug therapy: Secondary | ICD-10-CM | POA: Diagnosis not present

## 2021-12-02 DIAGNOSIS — E559 Vitamin D deficiency, unspecified: Secondary | ICD-10-CM | POA: Diagnosis not present

## 2021-12-12 DIAGNOSIS — D649 Anemia, unspecified: Secondary | ICD-10-CM | POA: Diagnosis not present

## 2021-12-12 DIAGNOSIS — E119 Type 2 diabetes mellitus without complications: Secondary | ICD-10-CM | POA: Diagnosis not present

## 2021-12-12 DIAGNOSIS — E785 Hyperlipidemia, unspecified: Secondary | ICD-10-CM | POA: Diagnosis not present

## 2021-12-19 DIAGNOSIS — I110 Hypertensive heart disease with heart failure: Secondary | ICD-10-CM | POA: Diagnosis not present

## 2021-12-19 DIAGNOSIS — M6250 Muscle wasting and atrophy, not elsewhere classified, unspecified site: Secondary | ICD-10-CM | POA: Diagnosis not present

## 2021-12-19 DIAGNOSIS — R293 Abnormal posture: Secondary | ICD-10-CM | POA: Diagnosis not present

## 2021-12-19 DIAGNOSIS — M62838 Other muscle spasm: Secondary | ICD-10-CM | POA: Diagnosis not present

## 2021-12-19 DIAGNOSIS — I5032 Chronic diastolic (congestive) heart failure: Secondary | ICD-10-CM | POA: Diagnosis not present

## 2021-12-19 DIAGNOSIS — R278 Other lack of coordination: Secondary | ICD-10-CM | POA: Diagnosis not present

## 2021-12-19 DIAGNOSIS — E119 Type 2 diabetes mellitus without complications: Secondary | ICD-10-CM | POA: Diagnosis not present

## 2021-12-19 DIAGNOSIS — F5101 Primary insomnia: Secondary | ICD-10-CM | POA: Diagnosis not present

## 2021-12-20 DIAGNOSIS — R278 Other lack of coordination: Secondary | ICD-10-CM | POA: Diagnosis not present

## 2021-12-20 DIAGNOSIS — E119 Type 2 diabetes mellitus without complications: Secondary | ICD-10-CM | POA: Diagnosis not present

## 2021-12-20 DIAGNOSIS — R293 Abnormal posture: Secondary | ICD-10-CM | POA: Diagnosis not present

## 2021-12-20 DIAGNOSIS — I110 Hypertensive heart disease with heart failure: Secondary | ICD-10-CM | POA: Diagnosis not present

## 2021-12-20 DIAGNOSIS — M6250 Muscle wasting and atrophy, not elsewhere classified, unspecified site: Secondary | ICD-10-CM | POA: Diagnosis not present

## 2021-12-20 DIAGNOSIS — M62838 Other muscle spasm: Secondary | ICD-10-CM | POA: Diagnosis not present

## 2021-12-20 DIAGNOSIS — I5032 Chronic diastolic (congestive) heart failure: Secondary | ICD-10-CM | POA: Diagnosis not present

## 2021-12-23 DIAGNOSIS — M6250 Muscle wasting and atrophy, not elsewhere classified, unspecified site: Secondary | ICD-10-CM | POA: Diagnosis not present

## 2021-12-23 DIAGNOSIS — M62838 Other muscle spasm: Secondary | ICD-10-CM | POA: Diagnosis not present

## 2021-12-23 DIAGNOSIS — I5032 Chronic diastolic (congestive) heart failure: Secondary | ICD-10-CM | POA: Diagnosis not present

## 2021-12-23 DIAGNOSIS — R278 Other lack of coordination: Secondary | ICD-10-CM | POA: Diagnosis not present

## 2021-12-23 DIAGNOSIS — E119 Type 2 diabetes mellitus without complications: Secondary | ICD-10-CM | POA: Diagnosis not present

## 2021-12-23 DIAGNOSIS — R293 Abnormal posture: Secondary | ICD-10-CM | POA: Diagnosis not present

## 2021-12-23 DIAGNOSIS — I110 Hypertensive heart disease with heart failure: Secondary | ICD-10-CM | POA: Diagnosis not present

## 2021-12-24 DIAGNOSIS — M62838 Other muscle spasm: Secondary | ICD-10-CM | POA: Diagnosis not present

## 2021-12-24 DIAGNOSIS — R293 Abnormal posture: Secondary | ICD-10-CM | POA: Diagnosis not present

## 2021-12-24 DIAGNOSIS — I5032 Chronic diastolic (congestive) heart failure: Secondary | ICD-10-CM | POA: Diagnosis not present

## 2021-12-24 DIAGNOSIS — M6250 Muscle wasting and atrophy, not elsewhere classified, unspecified site: Secondary | ICD-10-CM | POA: Diagnosis not present

## 2021-12-24 DIAGNOSIS — I110 Hypertensive heart disease with heart failure: Secondary | ICD-10-CM | POA: Diagnosis not present

## 2021-12-24 DIAGNOSIS — E119 Type 2 diabetes mellitus without complications: Secondary | ICD-10-CM | POA: Diagnosis not present

## 2021-12-24 DIAGNOSIS — R278 Other lack of coordination: Secondary | ICD-10-CM | POA: Diagnosis not present

## 2021-12-25 DIAGNOSIS — M6250 Muscle wasting and atrophy, not elsewhere classified, unspecified site: Secondary | ICD-10-CM | POA: Diagnosis not present

## 2021-12-25 DIAGNOSIS — R278 Other lack of coordination: Secondary | ICD-10-CM | POA: Diagnosis not present

## 2021-12-25 DIAGNOSIS — E119 Type 2 diabetes mellitus without complications: Secondary | ICD-10-CM | POA: Diagnosis not present

## 2021-12-25 DIAGNOSIS — I5032 Chronic diastolic (congestive) heart failure: Secondary | ICD-10-CM | POA: Diagnosis not present

## 2021-12-25 DIAGNOSIS — M62838 Other muscle spasm: Secondary | ICD-10-CM | POA: Diagnosis not present

## 2021-12-25 DIAGNOSIS — I110 Hypertensive heart disease with heart failure: Secondary | ICD-10-CM | POA: Diagnosis not present

## 2021-12-25 DIAGNOSIS — R293 Abnormal posture: Secondary | ICD-10-CM | POA: Diagnosis not present

## 2021-12-28 DIAGNOSIS — M62838 Other muscle spasm: Secondary | ICD-10-CM | POA: Diagnosis not present

## 2021-12-28 DIAGNOSIS — M6250 Muscle wasting and atrophy, not elsewhere classified, unspecified site: Secondary | ICD-10-CM | POA: Diagnosis not present

## 2021-12-28 DIAGNOSIS — R293 Abnormal posture: Secondary | ICD-10-CM | POA: Diagnosis not present

## 2021-12-28 DIAGNOSIS — E119 Type 2 diabetes mellitus without complications: Secondary | ICD-10-CM | POA: Diagnosis not present

## 2021-12-28 DIAGNOSIS — I110 Hypertensive heart disease with heart failure: Secondary | ICD-10-CM | POA: Diagnosis not present

## 2021-12-28 DIAGNOSIS — I5032 Chronic diastolic (congestive) heart failure: Secondary | ICD-10-CM | POA: Diagnosis not present

## 2021-12-28 DIAGNOSIS — R278 Other lack of coordination: Secondary | ICD-10-CM | POA: Diagnosis not present

## 2021-12-30 DIAGNOSIS — R293 Abnormal posture: Secondary | ICD-10-CM | POA: Diagnosis not present

## 2021-12-30 DIAGNOSIS — R278 Other lack of coordination: Secondary | ICD-10-CM | POA: Diagnosis not present

## 2021-12-30 DIAGNOSIS — I110 Hypertensive heart disease with heart failure: Secondary | ICD-10-CM | POA: Diagnosis not present

## 2021-12-30 DIAGNOSIS — M62838 Other muscle spasm: Secondary | ICD-10-CM | POA: Diagnosis not present

## 2021-12-30 DIAGNOSIS — E119 Type 2 diabetes mellitus without complications: Secondary | ICD-10-CM | POA: Diagnosis not present

## 2021-12-30 DIAGNOSIS — M6250 Muscle wasting and atrophy, not elsewhere classified, unspecified site: Secondary | ICD-10-CM | POA: Diagnosis not present

## 2021-12-30 DIAGNOSIS — I5032 Chronic diastolic (congestive) heart failure: Secondary | ICD-10-CM | POA: Diagnosis not present

## 2021-12-31 DIAGNOSIS — R293 Abnormal posture: Secondary | ICD-10-CM | POA: Diagnosis not present

## 2021-12-31 DIAGNOSIS — E119 Type 2 diabetes mellitus without complications: Secondary | ICD-10-CM | POA: Diagnosis not present

## 2021-12-31 DIAGNOSIS — M62838 Other muscle spasm: Secondary | ICD-10-CM | POA: Diagnosis not present

## 2021-12-31 DIAGNOSIS — I5032 Chronic diastolic (congestive) heart failure: Secondary | ICD-10-CM | POA: Diagnosis not present

## 2021-12-31 DIAGNOSIS — I110 Hypertensive heart disease with heart failure: Secondary | ICD-10-CM | POA: Diagnosis not present

## 2021-12-31 DIAGNOSIS — R278 Other lack of coordination: Secondary | ICD-10-CM | POA: Diagnosis not present

## 2021-12-31 DIAGNOSIS — M6250 Muscle wasting and atrophy, not elsewhere classified, unspecified site: Secondary | ICD-10-CM | POA: Diagnosis not present

## 2022-01-01 DIAGNOSIS — M62838 Other muscle spasm: Secondary | ICD-10-CM | POA: Diagnosis not present

## 2022-01-01 DIAGNOSIS — R278 Other lack of coordination: Secondary | ICD-10-CM | POA: Diagnosis not present

## 2022-01-01 DIAGNOSIS — R531 Weakness: Secondary | ICD-10-CM | POA: Diagnosis not present

## 2022-01-01 DIAGNOSIS — R293 Abnormal posture: Secondary | ICD-10-CM | POA: Diagnosis not present

## 2022-01-01 DIAGNOSIS — R54 Age-related physical debility: Secondary | ICD-10-CM | POA: Diagnosis not present

## 2022-01-01 DIAGNOSIS — R2681 Unsteadiness on feet: Secondary | ICD-10-CM | POA: Diagnosis not present

## 2022-01-01 DIAGNOSIS — M6250 Muscle wasting and atrophy, not elsewhere classified, unspecified site: Secondary | ICD-10-CM | POA: Diagnosis not present

## 2022-01-01 DIAGNOSIS — I5032 Chronic diastolic (congestive) heart failure: Secondary | ICD-10-CM | POA: Diagnosis not present

## 2022-01-01 DIAGNOSIS — E119 Type 2 diabetes mellitus without complications: Secondary | ICD-10-CM | POA: Diagnosis not present

## 2022-01-01 DIAGNOSIS — I110 Hypertensive heart disease with heart failure: Secondary | ICD-10-CM | POA: Diagnosis not present

## 2022-01-02 DIAGNOSIS — R278 Other lack of coordination: Secondary | ICD-10-CM | POA: Diagnosis not present

## 2022-01-02 DIAGNOSIS — I110 Hypertensive heart disease with heart failure: Secondary | ICD-10-CM | POA: Diagnosis not present

## 2022-01-02 DIAGNOSIS — M62838 Other muscle spasm: Secondary | ICD-10-CM | POA: Diagnosis not present

## 2022-01-02 DIAGNOSIS — I5032 Chronic diastolic (congestive) heart failure: Secondary | ICD-10-CM | POA: Diagnosis not present

## 2022-01-02 DIAGNOSIS — M6250 Muscle wasting and atrophy, not elsewhere classified, unspecified site: Secondary | ICD-10-CM | POA: Diagnosis not present

## 2022-01-02 DIAGNOSIS — E119 Type 2 diabetes mellitus without complications: Secondary | ICD-10-CM | POA: Diagnosis not present

## 2022-01-02 DIAGNOSIS — R293 Abnormal posture: Secondary | ICD-10-CM | POA: Diagnosis not present

## 2022-01-03 DIAGNOSIS — M62838 Other muscle spasm: Secondary | ICD-10-CM | POA: Diagnosis not present

## 2022-01-03 DIAGNOSIS — I110 Hypertensive heart disease with heart failure: Secondary | ICD-10-CM | POA: Diagnosis not present

## 2022-01-03 DIAGNOSIS — E119 Type 2 diabetes mellitus without complications: Secondary | ICD-10-CM | POA: Diagnosis not present

## 2022-01-03 DIAGNOSIS — M6250 Muscle wasting and atrophy, not elsewhere classified, unspecified site: Secondary | ICD-10-CM | POA: Diagnosis not present

## 2022-01-03 DIAGNOSIS — R293 Abnormal posture: Secondary | ICD-10-CM | POA: Diagnosis not present

## 2022-01-03 DIAGNOSIS — I5032 Chronic diastolic (congestive) heart failure: Secondary | ICD-10-CM | POA: Diagnosis not present

## 2022-01-03 DIAGNOSIS — R278 Other lack of coordination: Secondary | ICD-10-CM | POA: Diagnosis not present

## 2022-01-05 DIAGNOSIS — R293 Abnormal posture: Secondary | ICD-10-CM | POA: Diagnosis not present

## 2022-01-05 DIAGNOSIS — I5032 Chronic diastolic (congestive) heart failure: Secondary | ICD-10-CM | POA: Diagnosis not present

## 2022-01-05 DIAGNOSIS — R278 Other lack of coordination: Secondary | ICD-10-CM | POA: Diagnosis not present

## 2022-01-05 DIAGNOSIS — I110 Hypertensive heart disease with heart failure: Secondary | ICD-10-CM | POA: Diagnosis not present

## 2022-01-05 DIAGNOSIS — M62838 Other muscle spasm: Secondary | ICD-10-CM | POA: Diagnosis not present

## 2022-01-05 DIAGNOSIS — E119 Type 2 diabetes mellitus without complications: Secondary | ICD-10-CM | POA: Diagnosis not present

## 2022-01-05 DIAGNOSIS — M6250 Muscle wasting and atrophy, not elsewhere classified, unspecified site: Secondary | ICD-10-CM | POA: Diagnosis not present

## 2022-01-06 DIAGNOSIS — E119 Type 2 diabetes mellitus without complications: Secondary | ICD-10-CM | POA: Diagnosis not present

## 2022-01-06 DIAGNOSIS — R293 Abnormal posture: Secondary | ICD-10-CM | POA: Diagnosis not present

## 2022-01-06 DIAGNOSIS — I5032 Chronic diastolic (congestive) heart failure: Secondary | ICD-10-CM | POA: Diagnosis not present

## 2022-01-06 DIAGNOSIS — M62838 Other muscle spasm: Secondary | ICD-10-CM | POA: Diagnosis not present

## 2022-01-06 DIAGNOSIS — R278 Other lack of coordination: Secondary | ICD-10-CM | POA: Diagnosis not present

## 2022-01-06 DIAGNOSIS — I110 Hypertensive heart disease with heart failure: Secondary | ICD-10-CM | POA: Diagnosis not present

## 2022-01-06 DIAGNOSIS — M6250 Muscle wasting and atrophy, not elsewhere classified, unspecified site: Secondary | ICD-10-CM | POA: Diagnosis not present

## 2022-01-07 DIAGNOSIS — I110 Hypertensive heart disease with heart failure: Secondary | ICD-10-CM | POA: Diagnosis not present

## 2022-01-07 DIAGNOSIS — M6250 Muscle wasting and atrophy, not elsewhere classified, unspecified site: Secondary | ICD-10-CM | POA: Diagnosis not present

## 2022-01-07 DIAGNOSIS — R293 Abnormal posture: Secondary | ICD-10-CM | POA: Diagnosis not present

## 2022-01-07 DIAGNOSIS — R278 Other lack of coordination: Secondary | ICD-10-CM | POA: Diagnosis not present

## 2022-01-07 DIAGNOSIS — I5032 Chronic diastolic (congestive) heart failure: Secondary | ICD-10-CM | POA: Diagnosis not present

## 2022-01-07 DIAGNOSIS — M62838 Other muscle spasm: Secondary | ICD-10-CM | POA: Diagnosis not present

## 2022-01-07 DIAGNOSIS — E119 Type 2 diabetes mellitus without complications: Secondary | ICD-10-CM | POA: Diagnosis not present

## 2022-01-08 DIAGNOSIS — R54 Age-related physical debility: Secondary | ICD-10-CM | POA: Diagnosis not present

## 2022-01-08 DIAGNOSIS — R2681 Unsteadiness on feet: Secondary | ICD-10-CM | POA: Diagnosis not present

## 2022-01-08 DIAGNOSIS — R278 Other lack of coordination: Secondary | ICD-10-CM | POA: Diagnosis not present

## 2022-01-08 DIAGNOSIS — R531 Weakness: Secondary | ICD-10-CM | POA: Diagnosis not present

## 2022-01-08 DIAGNOSIS — I5032 Chronic diastolic (congestive) heart failure: Secondary | ICD-10-CM | POA: Diagnosis not present

## 2022-01-13 DIAGNOSIS — I110 Hypertensive heart disease with heart failure: Secondary | ICD-10-CM | POA: Diagnosis not present

## 2022-01-13 DIAGNOSIS — I5032 Chronic diastolic (congestive) heart failure: Secondary | ICD-10-CM | POA: Diagnosis not present

## 2022-01-13 DIAGNOSIS — E119 Type 2 diabetes mellitus without complications: Secondary | ICD-10-CM | POA: Diagnosis not present

## 2022-01-13 DIAGNOSIS — M6250 Muscle wasting and atrophy, not elsewhere classified, unspecified site: Secondary | ICD-10-CM | POA: Diagnosis not present

## 2022-01-13 DIAGNOSIS — R278 Other lack of coordination: Secondary | ICD-10-CM | POA: Diagnosis not present

## 2022-01-13 DIAGNOSIS — R293 Abnormal posture: Secondary | ICD-10-CM | POA: Diagnosis not present

## 2022-01-13 DIAGNOSIS — M62838 Other muscle spasm: Secondary | ICD-10-CM | POA: Diagnosis not present

## 2022-01-14 DIAGNOSIS — M6250 Muscle wasting and atrophy, not elsewhere classified, unspecified site: Secondary | ICD-10-CM | POA: Diagnosis not present

## 2022-01-14 DIAGNOSIS — E119 Type 2 diabetes mellitus without complications: Secondary | ICD-10-CM | POA: Diagnosis not present

## 2022-01-14 DIAGNOSIS — I110 Hypertensive heart disease with heart failure: Secondary | ICD-10-CM | POA: Diagnosis not present

## 2022-01-14 DIAGNOSIS — M62838 Other muscle spasm: Secondary | ICD-10-CM | POA: Diagnosis not present

## 2022-01-14 DIAGNOSIS — R278 Other lack of coordination: Secondary | ICD-10-CM | POA: Diagnosis not present

## 2022-01-14 DIAGNOSIS — I5032 Chronic diastolic (congestive) heart failure: Secondary | ICD-10-CM | POA: Diagnosis not present

## 2022-01-14 DIAGNOSIS — R293 Abnormal posture: Secondary | ICD-10-CM | POA: Diagnosis not present

## 2022-01-27 DIAGNOSIS — E119 Type 2 diabetes mellitus without complications: Secondary | ICD-10-CM | POA: Diagnosis not present

## 2022-01-27 DIAGNOSIS — D649 Anemia, unspecified: Secondary | ICD-10-CM | POA: Diagnosis not present

## 2022-01-27 DIAGNOSIS — R339 Retention of urine, unspecified: Secondary | ICD-10-CM | POA: Diagnosis not present

## 2022-01-27 DIAGNOSIS — E785 Hyperlipidemia, unspecified: Secondary | ICD-10-CM | POA: Diagnosis not present

## 2022-01-27 DIAGNOSIS — I5032 Chronic diastolic (congestive) heart failure: Secondary | ICD-10-CM | POA: Diagnosis not present

## 2022-01-27 DIAGNOSIS — I10 Essential (primary) hypertension: Secondary | ICD-10-CM | POA: Diagnosis not present

## 2022-02-03 DIAGNOSIS — F5101 Primary insomnia: Secondary | ICD-10-CM | POA: Diagnosis not present

## 2022-02-07 DIAGNOSIS — L304 Erythema intertrigo: Secondary | ICD-10-CM | POA: Diagnosis not present

## 2022-02-20 DIAGNOSIS — L304 Erythema intertrigo: Secondary | ICD-10-CM | POA: Diagnosis not present

## 2022-03-07 IMAGING — PT NM PET SKULL BASE TO THIGH
7 series · 25 of 25 positions shown · non-contrast
Comparison: None.

CLINICAL DATA: Prostate carcinoma with biochemical recurrence.

EXAM:
NUCLEAR MEDICINE PET SKULL BASE TO THIGH
TECHNIQUE: 10.23 mCi F-18 Fluciclovine was injected intravenously. Full-ring
PET imaging was performed from the skull base to thigh after the
radiotracer. CT data was obtained and used for attenuation
correction and anatomic localization.

[Series 3: pet sk_thigh ac · axial · 5.0mm · 4.07mm/px · z∈[-400,+548]mm · 5 of 238 slices shown]
[im 1/238]
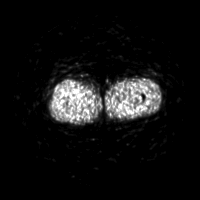
[im 60/238]
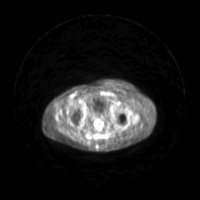
[im 119/238]
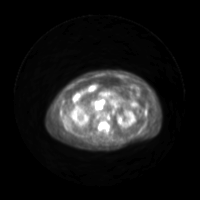
[im 178/238]
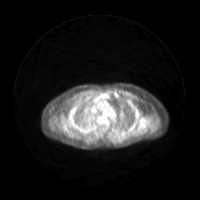
[im 238/238]
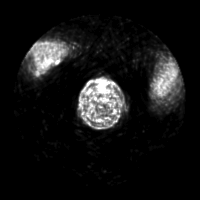

[Series 4: ct sk_thigh 5.0 hd_fov · axial · 5.0mm · 1.52mm/px · z∈[-388,+548]mm · 5 of 235 slices shown]
[im 1/235]
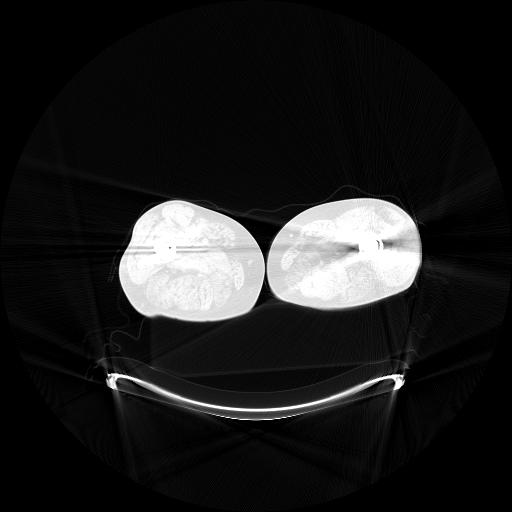
[im 59/235]
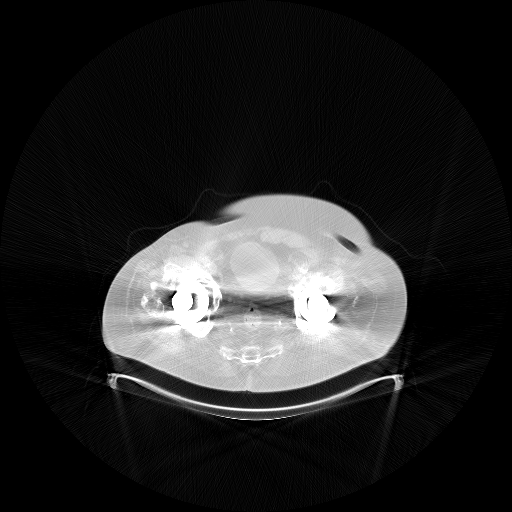
[im 118/235]
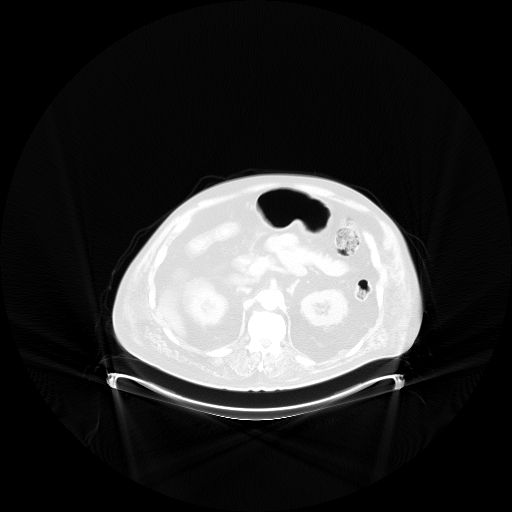
[im 176/235]
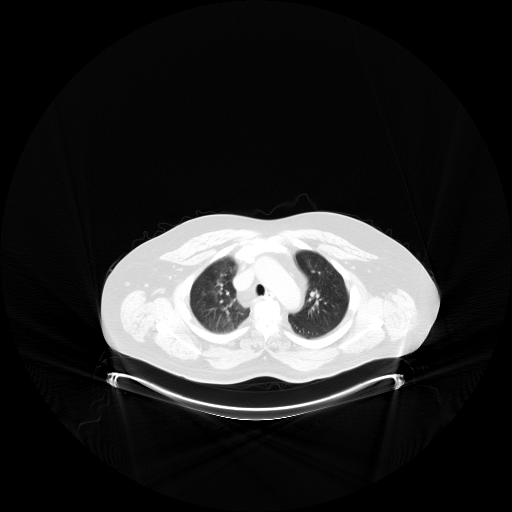
[im 235/235]
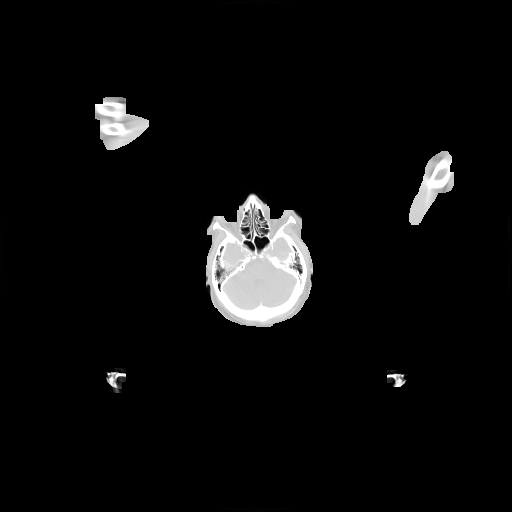

[Series 5: pet sk_thigh nac · axial · 5.0mm · 4.07mm/px · z∈[-400,+548]mm · 5 of 238 slices shown]
[im 1/238]
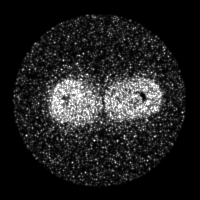
[im 60/238]
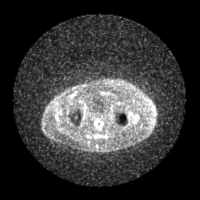
[im 119/238]
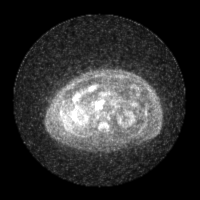
[im 178/238]
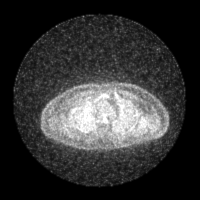
[im 238/238]
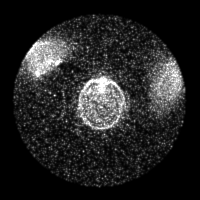

[Series 8: ct sk_thigh 5.0 b70f (id)_bone · axial · 5.0mm · 0.83mm/px · z∈[+81,+401]mm · 2 of 81 slices shown]
[im 1/81  bone]
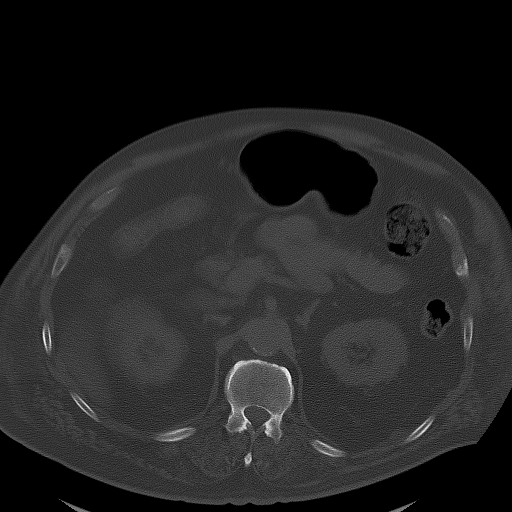
[im 81/81  bone]
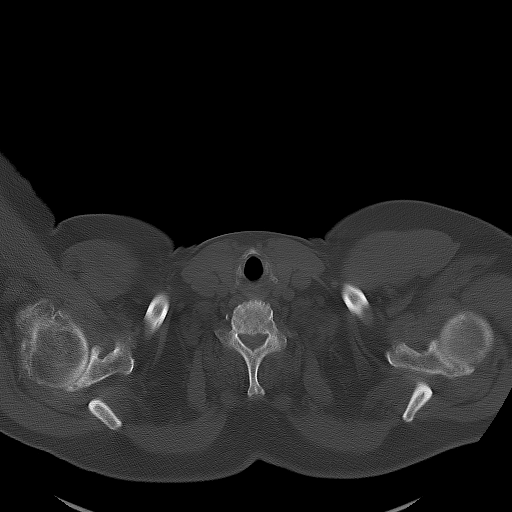

[Series 603: range-ct sk_thigh 5.0 hd_fov-cor-<alpha range> · 2 of 101 slices shown]
[im 1/101]
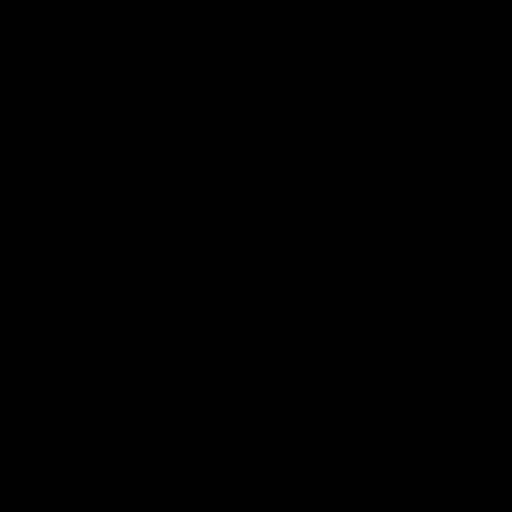
[im 101/101]
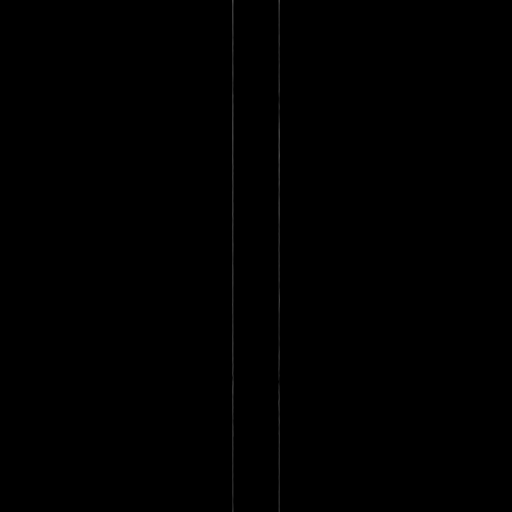

[Series 604: mip range 3 · coronal · 1.97mm/px · 1 of 32 slices shown]
[im 1/32]
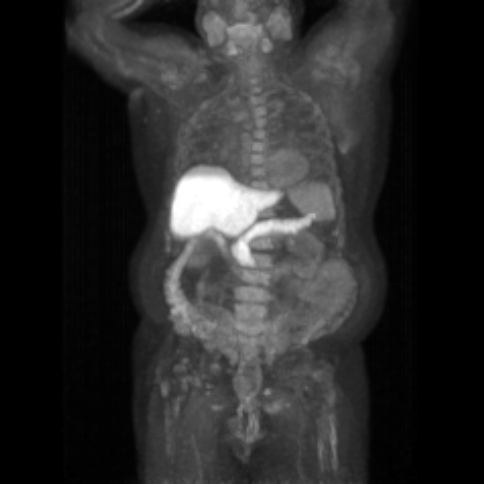

[Series 605: range-ct sk_thigh 5.0 hd_fov-tra-<alpha range> · 5 of 232 slices shown]
[im 1/232]
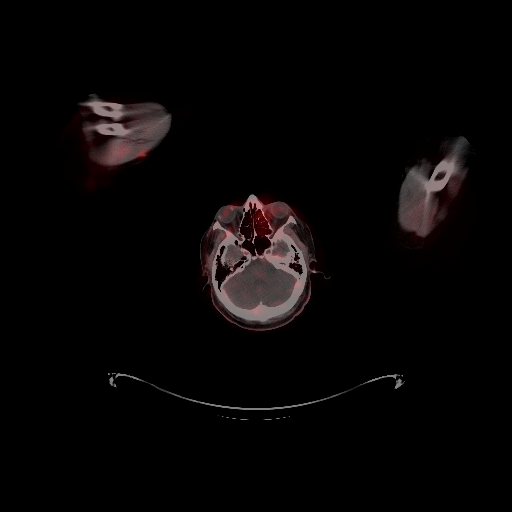
[im 58/232]
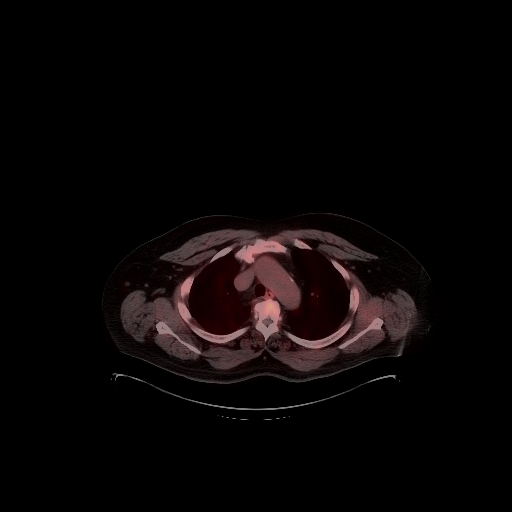
[im 116/232]
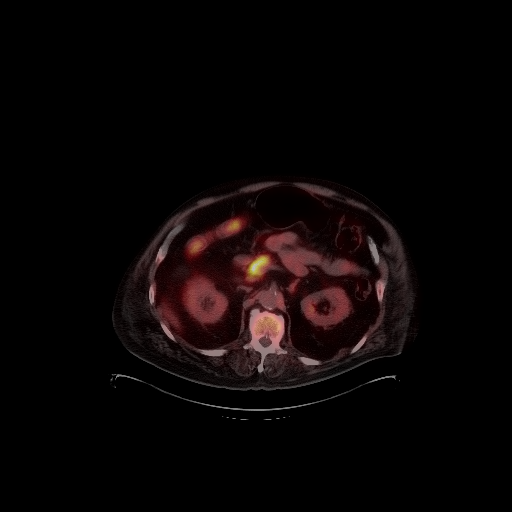
[im 174/232]
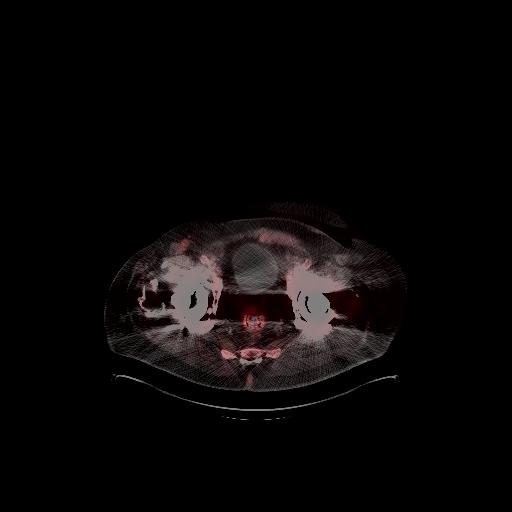
[im 232/232]
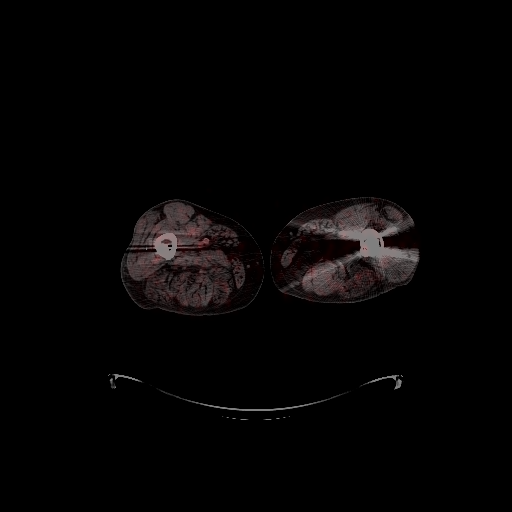

[25 of 25 positions shown; findings below may reference images not displayed]

FINDINGS: NECK

No radiotracer activity in neck lymph nodes.

Incidental CT finding: None

CHEST

No radiotracer accumulation within mediastinal or hilar lymph nodes.
No suspicious pulmonary nodules on the CT scan.

Incidental CT finding: 7 mm nodule RIGHT middle lobe along the
horizontal fissure (image 71/4). No associated radiotracer activity

ABDOMEN/PELVIS

Prostate: No focal activity in the prostate bed.

Lymph nodes: There is intense radiotracer activity in the region of
LEFT operator space SUV max equal 5.6. There is significant streak
artifact the through this region; however, the activity is suspected
to correlate with a 13 mm lymph node (image 170/4).

LEFT external iliac lymph node slightly higher measuring 7 mm (image
166) and has mild but measurable activity with SUV max equal 2.8.

Small LEFT periaortic lymph node just above the bifurcation
measuring 8 mm (image 143) does not have clear radiotracer activity.

Liver: No evidence of liver metastasis

Incidental CT finding: None

SKELETON

There is diffuse fairly intense activity throughout the spine. For
example a SUV max of the L3 vertebral body equal 6.8. There is no
focal radiotracer activity. There is no lytic or sclerotic lesion on
the CT portion exam. Dense osteophytosis noted.

Bilateral hip prosthetics.
IMPRESSION: 1. While the evaluation the pelvis is limited by the bilateral hip
prosthetics, high suspicion for a prostate cancer metastatic LEFT
operator node.
2. Additional LEFT internal iliac lymph node is also concerning for
metastatic prostate cancer adenopathy with mild uptake of the
prostate cancer specific radiotracer.
3. No evidence of skeletal metastasis.
4. Pulmonary nodule in the RIGHT middle lobe is favored unrelated to
prostate cancer. Recommend attention on follow-up.

## 2022-03-11 DIAGNOSIS — I10 Essential (primary) hypertension: Secondary | ICD-10-CM | POA: Diagnosis not present

## 2022-03-11 DIAGNOSIS — U071 COVID-19: Secondary | ICD-10-CM | POA: Diagnosis not present

## 2022-03-31 DIAGNOSIS — R278 Other lack of coordination: Secondary | ICD-10-CM | POA: Diagnosis not present

## 2022-03-31 DIAGNOSIS — M6250 Muscle wasting and atrophy, not elsewhere classified, unspecified site: Secondary | ICD-10-CM | POA: Diagnosis not present

## 2022-03-31 DIAGNOSIS — I5032 Chronic diastolic (congestive) heart failure: Secondary | ICD-10-CM | POA: Diagnosis not present

## 2022-04-04 DIAGNOSIS — I5032 Chronic diastolic (congestive) heart failure: Secondary | ICD-10-CM | POA: Diagnosis not present

## 2022-04-04 DIAGNOSIS — E119 Type 2 diabetes mellitus without complications: Secondary | ICD-10-CM | POA: Diagnosis not present

## 2022-04-04 DIAGNOSIS — E785 Hyperlipidemia, unspecified: Secondary | ICD-10-CM | POA: Diagnosis not present

## 2022-04-04 DIAGNOSIS — I10 Essential (primary) hypertension: Secondary | ICD-10-CM | POA: Diagnosis not present

## 2022-04-04 DIAGNOSIS — G4700 Insomnia, unspecified: Secondary | ICD-10-CM | POA: Diagnosis not present

## 2022-04-04 DIAGNOSIS — R278 Other lack of coordination: Secondary | ICD-10-CM | POA: Diagnosis not present

## 2022-04-04 DIAGNOSIS — M6250 Muscle wasting and atrophy, not elsewhere classified, unspecified site: Secondary | ICD-10-CM | POA: Diagnosis not present

## 2022-04-06 DIAGNOSIS — I5032 Chronic diastolic (congestive) heart failure: Secondary | ICD-10-CM | POA: Diagnosis not present

## 2022-04-06 DIAGNOSIS — R278 Other lack of coordination: Secondary | ICD-10-CM | POA: Diagnosis not present

## 2022-04-06 DIAGNOSIS — M6250 Muscle wasting and atrophy, not elsewhere classified, unspecified site: Secondary | ICD-10-CM | POA: Diagnosis not present

## 2022-04-08 DIAGNOSIS — R278 Other lack of coordination: Secondary | ICD-10-CM | POA: Diagnosis not present

## 2022-04-08 DIAGNOSIS — M6250 Muscle wasting and atrophy, not elsewhere classified, unspecified site: Secondary | ICD-10-CM | POA: Diagnosis not present

## 2022-04-08 DIAGNOSIS — I5032 Chronic diastolic (congestive) heart failure: Secondary | ICD-10-CM | POA: Diagnosis not present

## 2022-04-11 DIAGNOSIS — I5032 Chronic diastolic (congestive) heart failure: Secondary | ICD-10-CM | POA: Diagnosis not present

## 2022-04-11 DIAGNOSIS — R278 Other lack of coordination: Secondary | ICD-10-CM | POA: Diagnosis not present

## 2022-04-11 DIAGNOSIS — M6250 Muscle wasting and atrophy, not elsewhere classified, unspecified site: Secondary | ICD-10-CM | POA: Diagnosis not present

## 2022-04-12 DIAGNOSIS — I5032 Chronic diastolic (congestive) heart failure: Secondary | ICD-10-CM | POA: Diagnosis not present

## 2022-04-12 DIAGNOSIS — M6250 Muscle wasting and atrophy, not elsewhere classified, unspecified site: Secondary | ICD-10-CM | POA: Diagnosis not present

## 2022-04-12 DIAGNOSIS — R278 Other lack of coordination: Secondary | ICD-10-CM | POA: Diagnosis not present

## 2022-04-14 DIAGNOSIS — R278 Other lack of coordination: Secondary | ICD-10-CM | POA: Diagnosis not present

## 2022-04-14 DIAGNOSIS — I5032 Chronic diastolic (congestive) heart failure: Secondary | ICD-10-CM | POA: Diagnosis not present

## 2022-04-14 DIAGNOSIS — M6250 Muscle wasting and atrophy, not elsewhere classified, unspecified site: Secondary | ICD-10-CM | POA: Diagnosis not present

## 2022-04-15 DIAGNOSIS — M6250 Muscle wasting and atrophy, not elsewhere classified, unspecified site: Secondary | ICD-10-CM | POA: Diagnosis not present

## 2022-04-15 DIAGNOSIS — R278 Other lack of coordination: Secondary | ICD-10-CM | POA: Diagnosis not present

## 2022-04-15 DIAGNOSIS — I5032 Chronic diastolic (congestive) heart failure: Secondary | ICD-10-CM | POA: Diagnosis not present

## 2022-04-16 DIAGNOSIS — R278 Other lack of coordination: Secondary | ICD-10-CM | POA: Diagnosis not present

## 2022-04-16 DIAGNOSIS — M6250 Muscle wasting and atrophy, not elsewhere classified, unspecified site: Secondary | ICD-10-CM | POA: Diagnosis not present

## 2022-04-16 DIAGNOSIS — I5032 Chronic diastolic (congestive) heart failure: Secondary | ICD-10-CM | POA: Diagnosis not present

## 2022-04-22 DIAGNOSIS — M6250 Muscle wasting and atrophy, not elsewhere classified, unspecified site: Secondary | ICD-10-CM | POA: Diagnosis not present

## 2022-04-22 DIAGNOSIS — R278 Other lack of coordination: Secondary | ICD-10-CM | POA: Diagnosis not present

## 2022-04-22 DIAGNOSIS — I5032 Chronic diastolic (congestive) heart failure: Secondary | ICD-10-CM | POA: Diagnosis not present

## 2022-04-30 DIAGNOSIS — N390 Urinary tract infection, site not specified: Secondary | ICD-10-CM | POA: Diagnosis not present

## 2022-05-01 DIAGNOSIS — M2042 Other hammer toe(s) (acquired), left foot: Secondary | ICD-10-CM | POA: Diagnosis not present

## 2022-05-01 DIAGNOSIS — I739 Peripheral vascular disease, unspecified: Secondary | ICD-10-CM | POA: Diagnosis not present

## 2022-05-01 DIAGNOSIS — B351 Tinea unguium: Secondary | ICD-10-CM | POA: Diagnosis not present

## 2022-05-01 DIAGNOSIS — L603 Nail dystrophy: Secondary | ICD-10-CM | POA: Diagnosis not present

## 2022-05-01 DIAGNOSIS — M2041 Other hammer toe(s) (acquired), right foot: Secondary | ICD-10-CM | POA: Diagnosis not present

## 2022-05-02 DIAGNOSIS — R300 Dysuria: Secondary | ICD-10-CM | POA: Diagnosis not present

## 2022-05-02 DIAGNOSIS — N390 Urinary tract infection, site not specified: Secondary | ICD-10-CM | POA: Diagnosis not present

## 2022-05-05 DIAGNOSIS — N390 Urinary tract infection, site not specified: Secondary | ICD-10-CM | POA: Diagnosis not present

## 2022-05-14 DIAGNOSIS — R4189 Other symptoms and signs involving cognitive functions and awareness: Secondary | ICD-10-CM | POA: Diagnosis not present

## 2022-05-14 DIAGNOSIS — E119 Type 2 diabetes mellitus without complications: Secondary | ICD-10-CM | POA: Diagnosis not present

## 2022-05-14 DIAGNOSIS — I5032 Chronic diastolic (congestive) heart failure: Secondary | ICD-10-CM | POA: Diagnosis not present

## 2022-05-14 DIAGNOSIS — I10 Essential (primary) hypertension: Secondary | ICD-10-CM | POA: Diagnosis not present

## 2022-05-14 DIAGNOSIS — N390 Urinary tract infection, site not specified: Secondary | ICD-10-CM | POA: Diagnosis not present

## 2022-05-14 DIAGNOSIS — R300 Dysuria: Secondary | ICD-10-CM | POA: Diagnosis not present

## 2022-07-09 DIAGNOSIS — I10 Essential (primary) hypertension: Secondary | ICD-10-CM | POA: Diagnosis not present

## 2022-07-09 DIAGNOSIS — D649 Anemia, unspecified: Secondary | ICD-10-CM | POA: Diagnosis not present

## 2022-07-09 DIAGNOSIS — G4700 Insomnia, unspecified: Secondary | ICD-10-CM | POA: Diagnosis not present

## 2022-08-01 DIAGNOSIS — R300 Dysuria: Secondary | ICD-10-CM | POA: Diagnosis not present

## 2022-08-01 DIAGNOSIS — R339 Retention of urine, unspecified: Secondary | ICD-10-CM | POA: Diagnosis not present

## 2022-08-04 DIAGNOSIS — N390 Urinary tract infection, site not specified: Secondary | ICD-10-CM | POA: Diagnosis not present

## 2022-08-08 DIAGNOSIS — N390 Urinary tract infection, site not specified: Secondary | ICD-10-CM | POA: Diagnosis not present

## 2022-08-11 DIAGNOSIS — N390 Urinary tract infection, site not specified: Secondary | ICD-10-CM | POA: Diagnosis not present

## 2022-08-13 DIAGNOSIS — R339 Retention of urine, unspecified: Secondary | ICD-10-CM | POA: Diagnosis not present

## 2022-08-13 DIAGNOSIS — I110 Hypertensive heart disease with heart failure: Secondary | ICD-10-CM | POA: Diagnosis not present

## 2022-08-13 DIAGNOSIS — N390 Urinary tract infection, site not specified: Secondary | ICD-10-CM | POA: Diagnosis not present

## 2022-08-13 DIAGNOSIS — R4189 Other symptoms and signs involving cognitive functions and awareness: Secondary | ICD-10-CM | POA: Diagnosis not present

## 2022-08-19 DIAGNOSIS — I739 Peripheral vascular disease, unspecified: Secondary | ICD-10-CM | POA: Diagnosis not present

## 2022-08-19 DIAGNOSIS — I5032 Chronic diastolic (congestive) heart failure: Secondary | ICD-10-CM | POA: Diagnosis not present

## 2022-08-19 DIAGNOSIS — I110 Hypertensive heart disease with heart failure: Secondary | ICD-10-CM | POA: Diagnosis not present

## 2022-08-19 DIAGNOSIS — R278 Other lack of coordination: Secondary | ICD-10-CM | POA: Diagnosis not present

## 2022-08-19 DIAGNOSIS — M6250 Muscle wasting and atrophy, not elsewhere classified, unspecified site: Secondary | ICD-10-CM | POA: Diagnosis not present

## 2022-08-22 DIAGNOSIS — M6250 Muscle wasting and atrophy, not elsewhere classified, unspecified site: Secondary | ICD-10-CM | POA: Diagnosis not present

## 2022-08-22 DIAGNOSIS — I110 Hypertensive heart disease with heart failure: Secondary | ICD-10-CM | POA: Diagnosis not present

## 2022-08-22 DIAGNOSIS — I5032 Chronic diastolic (congestive) heart failure: Secondary | ICD-10-CM | POA: Diagnosis not present

## 2022-08-22 DIAGNOSIS — R278 Other lack of coordination: Secondary | ICD-10-CM | POA: Diagnosis not present

## 2022-08-22 DIAGNOSIS — I739 Peripheral vascular disease, unspecified: Secondary | ICD-10-CM | POA: Diagnosis not present

## 2022-08-25 DIAGNOSIS — M6250 Muscle wasting and atrophy, not elsewhere classified, unspecified site: Secondary | ICD-10-CM | POA: Diagnosis not present

## 2022-08-25 DIAGNOSIS — R278 Other lack of coordination: Secondary | ICD-10-CM | POA: Diagnosis not present

## 2022-08-25 DIAGNOSIS — I5032 Chronic diastolic (congestive) heart failure: Secondary | ICD-10-CM | POA: Diagnosis not present

## 2022-08-25 DIAGNOSIS — I739 Peripheral vascular disease, unspecified: Secondary | ICD-10-CM | POA: Diagnosis not present

## 2022-08-25 DIAGNOSIS — I110 Hypertensive heart disease with heart failure: Secondary | ICD-10-CM | POA: Diagnosis not present

## 2022-08-26 DIAGNOSIS — R278 Other lack of coordination: Secondary | ICD-10-CM | POA: Diagnosis not present

## 2022-08-26 DIAGNOSIS — M6250 Muscle wasting and atrophy, not elsewhere classified, unspecified site: Secondary | ICD-10-CM | POA: Diagnosis not present

## 2022-08-26 DIAGNOSIS — I5032 Chronic diastolic (congestive) heart failure: Secondary | ICD-10-CM | POA: Diagnosis not present

## 2022-08-26 DIAGNOSIS — I739 Peripheral vascular disease, unspecified: Secondary | ICD-10-CM | POA: Diagnosis not present

## 2022-08-26 DIAGNOSIS — I110 Hypertensive heart disease with heart failure: Secondary | ICD-10-CM | POA: Diagnosis not present

## 2022-08-27 DIAGNOSIS — I5032 Chronic diastolic (congestive) heart failure: Secondary | ICD-10-CM | POA: Diagnosis not present

## 2022-08-27 DIAGNOSIS — M6250 Muscle wasting and atrophy, not elsewhere classified, unspecified site: Secondary | ICD-10-CM | POA: Diagnosis not present

## 2022-08-27 DIAGNOSIS — I739 Peripheral vascular disease, unspecified: Secondary | ICD-10-CM | POA: Diagnosis not present

## 2022-08-27 DIAGNOSIS — I110 Hypertensive heart disease with heart failure: Secondary | ICD-10-CM | POA: Diagnosis not present

## 2022-08-27 DIAGNOSIS — R278 Other lack of coordination: Secondary | ICD-10-CM | POA: Diagnosis not present

## 2022-08-28 DIAGNOSIS — I739 Peripheral vascular disease, unspecified: Secondary | ICD-10-CM | POA: Diagnosis not present

## 2022-08-28 DIAGNOSIS — R278 Other lack of coordination: Secondary | ICD-10-CM | POA: Diagnosis not present

## 2022-08-28 DIAGNOSIS — M6250 Muscle wasting and atrophy, not elsewhere classified, unspecified site: Secondary | ICD-10-CM | POA: Diagnosis not present

## 2022-08-28 DIAGNOSIS — I110 Hypertensive heart disease with heart failure: Secondary | ICD-10-CM | POA: Diagnosis not present

## 2022-08-28 DIAGNOSIS — I5032 Chronic diastolic (congestive) heart failure: Secondary | ICD-10-CM | POA: Diagnosis not present

## 2022-09-04 DIAGNOSIS — R278 Other lack of coordination: Secondary | ICD-10-CM | POA: Diagnosis not present

## 2022-09-04 DIAGNOSIS — I5032 Chronic diastolic (congestive) heart failure: Secondary | ICD-10-CM | POA: Diagnosis not present

## 2022-09-04 DIAGNOSIS — I739 Peripheral vascular disease, unspecified: Secondary | ICD-10-CM | POA: Diagnosis not present

## 2022-09-04 DIAGNOSIS — I110 Hypertensive heart disease with heart failure: Secondary | ICD-10-CM | POA: Diagnosis not present

## 2022-09-04 DIAGNOSIS — M6250 Muscle wasting and atrophy, not elsewhere classified, unspecified site: Secondary | ICD-10-CM | POA: Diagnosis not present

## 2022-09-05 DIAGNOSIS — I739 Peripheral vascular disease, unspecified: Secondary | ICD-10-CM | POA: Diagnosis not present

## 2022-09-05 DIAGNOSIS — R278 Other lack of coordination: Secondary | ICD-10-CM | POA: Diagnosis not present

## 2022-09-05 DIAGNOSIS — I110 Hypertensive heart disease with heart failure: Secondary | ICD-10-CM | POA: Diagnosis not present

## 2022-09-05 DIAGNOSIS — M6250 Muscle wasting and atrophy, not elsewhere classified, unspecified site: Secondary | ICD-10-CM | POA: Diagnosis not present

## 2022-09-05 DIAGNOSIS — I5032 Chronic diastolic (congestive) heart failure: Secondary | ICD-10-CM | POA: Diagnosis not present

## 2022-09-08 DIAGNOSIS — M6250 Muscle wasting and atrophy, not elsewhere classified, unspecified site: Secondary | ICD-10-CM | POA: Diagnosis not present

## 2022-09-08 DIAGNOSIS — I5032 Chronic diastolic (congestive) heart failure: Secondary | ICD-10-CM | POA: Diagnosis not present

## 2022-09-08 DIAGNOSIS — I110 Hypertensive heart disease with heart failure: Secondary | ICD-10-CM | POA: Diagnosis not present

## 2022-09-08 DIAGNOSIS — R278 Other lack of coordination: Secondary | ICD-10-CM | POA: Diagnosis not present

## 2022-09-08 DIAGNOSIS — I739 Peripheral vascular disease, unspecified: Secondary | ICD-10-CM | POA: Diagnosis not present

## 2022-09-09 DIAGNOSIS — I739 Peripheral vascular disease, unspecified: Secondary | ICD-10-CM | POA: Diagnosis not present

## 2022-09-09 DIAGNOSIS — I110 Hypertensive heart disease with heart failure: Secondary | ICD-10-CM | POA: Diagnosis not present

## 2022-09-09 DIAGNOSIS — D649 Anemia, unspecified: Secondary | ICD-10-CM | POA: Diagnosis not present

## 2022-09-09 DIAGNOSIS — E785 Hyperlipidemia, unspecified: Secondary | ICD-10-CM | POA: Diagnosis not present

## 2022-09-09 DIAGNOSIS — I5032 Chronic diastolic (congestive) heart failure: Secondary | ICD-10-CM | POA: Diagnosis not present

## 2022-09-09 DIAGNOSIS — M6250 Muscle wasting and atrophy, not elsewhere classified, unspecified site: Secondary | ICD-10-CM | POA: Diagnosis not present

## 2022-09-09 DIAGNOSIS — R278 Other lack of coordination: Secondary | ICD-10-CM | POA: Diagnosis not present

## 2022-09-11 DIAGNOSIS — I110 Hypertensive heart disease with heart failure: Secondary | ICD-10-CM | POA: Diagnosis not present

## 2022-09-11 DIAGNOSIS — I5032 Chronic diastolic (congestive) heart failure: Secondary | ICD-10-CM | POA: Diagnosis not present

## 2022-09-11 DIAGNOSIS — R278 Other lack of coordination: Secondary | ICD-10-CM | POA: Diagnosis not present

## 2022-09-11 DIAGNOSIS — N390 Urinary tract infection, site not specified: Secondary | ICD-10-CM | POA: Diagnosis not present

## 2022-09-11 DIAGNOSIS — M6250 Muscle wasting and atrophy, not elsewhere classified, unspecified site: Secondary | ICD-10-CM | POA: Diagnosis not present

## 2022-09-11 DIAGNOSIS — I739 Peripheral vascular disease, unspecified: Secondary | ICD-10-CM | POA: Diagnosis not present

## 2022-09-15 DIAGNOSIS — R278 Other lack of coordination: Secondary | ICD-10-CM | POA: Diagnosis not present

## 2022-09-15 DIAGNOSIS — I5032 Chronic diastolic (congestive) heart failure: Secondary | ICD-10-CM | POA: Diagnosis not present

## 2022-09-15 DIAGNOSIS — I739 Peripheral vascular disease, unspecified: Secondary | ICD-10-CM | POA: Diagnosis not present

## 2022-09-15 DIAGNOSIS — M6250 Muscle wasting and atrophy, not elsewhere classified, unspecified site: Secondary | ICD-10-CM | POA: Diagnosis not present

## 2022-09-15 DIAGNOSIS — I110 Hypertensive heart disease with heart failure: Secondary | ICD-10-CM | POA: Diagnosis not present

## 2022-09-17 DIAGNOSIS — I110 Hypertensive heart disease with heart failure: Secondary | ICD-10-CM | POA: Diagnosis not present

## 2022-09-17 DIAGNOSIS — I739 Peripheral vascular disease, unspecified: Secondary | ICD-10-CM | POA: Diagnosis not present

## 2022-09-17 DIAGNOSIS — M6250 Muscle wasting and atrophy, not elsewhere classified, unspecified site: Secondary | ICD-10-CM | POA: Diagnosis not present

## 2022-09-17 DIAGNOSIS — R278 Other lack of coordination: Secondary | ICD-10-CM | POA: Diagnosis not present

## 2022-09-17 DIAGNOSIS — I5032 Chronic diastolic (congestive) heart failure: Secondary | ICD-10-CM | POA: Diagnosis not present

## 2022-09-18 DIAGNOSIS — M6250 Muscle wasting and atrophy, not elsewhere classified, unspecified site: Secondary | ICD-10-CM | POA: Diagnosis not present

## 2022-09-18 DIAGNOSIS — I5032 Chronic diastolic (congestive) heart failure: Secondary | ICD-10-CM | POA: Diagnosis not present

## 2022-09-18 DIAGNOSIS — I739 Peripheral vascular disease, unspecified: Secondary | ICD-10-CM | POA: Diagnosis not present

## 2022-09-18 DIAGNOSIS — R278 Other lack of coordination: Secondary | ICD-10-CM | POA: Diagnosis not present

## 2022-09-18 DIAGNOSIS — I110 Hypertensive heart disease with heart failure: Secondary | ICD-10-CM | POA: Diagnosis not present

## 2022-09-19 DIAGNOSIS — I739 Peripheral vascular disease, unspecified: Secondary | ICD-10-CM | POA: Diagnosis not present

## 2022-09-19 DIAGNOSIS — R278 Other lack of coordination: Secondary | ICD-10-CM | POA: Diagnosis not present

## 2022-09-19 DIAGNOSIS — I5032 Chronic diastolic (congestive) heart failure: Secondary | ICD-10-CM | POA: Diagnosis not present

## 2022-09-19 DIAGNOSIS — M6250 Muscle wasting and atrophy, not elsewhere classified, unspecified site: Secondary | ICD-10-CM | POA: Diagnosis not present

## 2022-09-19 DIAGNOSIS — I110 Hypertensive heart disease with heart failure: Secondary | ICD-10-CM | POA: Diagnosis not present

## 2022-09-24 DIAGNOSIS — R2681 Unsteadiness on feet: Secondary | ICD-10-CM | POA: Diagnosis not present

## 2022-09-24 DIAGNOSIS — I739 Peripheral vascular disease, unspecified: Secondary | ICD-10-CM | POA: Diagnosis not present

## 2022-09-24 DIAGNOSIS — I5032 Chronic diastolic (congestive) heart failure: Secondary | ICD-10-CM | POA: Diagnosis not present

## 2022-09-24 DIAGNOSIS — R278 Other lack of coordination: Secondary | ICD-10-CM | POA: Diagnosis not present

## 2022-09-24 DIAGNOSIS — I110 Hypertensive heart disease with heart failure: Secondary | ICD-10-CM | POA: Diagnosis not present

## 2022-09-24 DIAGNOSIS — M6250 Muscle wasting and atrophy, not elsewhere classified, unspecified site: Secondary | ICD-10-CM | POA: Diagnosis not present

## 2022-09-24 DIAGNOSIS — R54 Age-related physical debility: Secondary | ICD-10-CM | POA: Diagnosis not present

## 2022-09-26 DIAGNOSIS — I110 Hypertensive heart disease with heart failure: Secondary | ICD-10-CM | POA: Diagnosis not present

## 2022-09-26 DIAGNOSIS — I739 Peripheral vascular disease, unspecified: Secondary | ICD-10-CM | POA: Diagnosis not present

## 2022-09-26 DIAGNOSIS — I5032 Chronic diastolic (congestive) heart failure: Secondary | ICD-10-CM | POA: Diagnosis not present

## 2022-09-26 DIAGNOSIS — M6250 Muscle wasting and atrophy, not elsewhere classified, unspecified site: Secondary | ICD-10-CM | POA: Diagnosis not present

## 2022-09-26 DIAGNOSIS — R278 Other lack of coordination: Secondary | ICD-10-CM | POA: Diagnosis not present

## 2022-09-29 DIAGNOSIS — R278 Other lack of coordination: Secondary | ICD-10-CM | POA: Diagnosis not present

## 2022-09-29 DIAGNOSIS — L603 Nail dystrophy: Secondary | ICD-10-CM | POA: Diagnosis not present

## 2022-09-29 DIAGNOSIS — I739 Peripheral vascular disease, unspecified: Secondary | ICD-10-CM | POA: Diagnosis not present

## 2022-09-29 DIAGNOSIS — R2681 Unsteadiness on feet: Secondary | ICD-10-CM | POA: Diagnosis not present

## 2022-09-29 DIAGNOSIS — R54 Age-related physical debility: Secondary | ICD-10-CM | POA: Diagnosis not present

## 2022-09-29 DIAGNOSIS — I5032 Chronic diastolic (congestive) heart failure: Secondary | ICD-10-CM | POA: Diagnosis not present

## 2022-09-30 DIAGNOSIS — R278 Other lack of coordination: Secondary | ICD-10-CM | POA: Diagnosis not present

## 2022-09-30 DIAGNOSIS — M6250 Muscle wasting and atrophy, not elsewhere classified, unspecified site: Secondary | ICD-10-CM | POA: Diagnosis not present

## 2022-09-30 DIAGNOSIS — I5032 Chronic diastolic (congestive) heart failure: Secondary | ICD-10-CM | POA: Diagnosis not present

## 2022-09-30 DIAGNOSIS — I739 Peripheral vascular disease, unspecified: Secondary | ICD-10-CM | POA: Diagnosis not present

## 2022-09-30 DIAGNOSIS — I110 Hypertensive heart disease with heart failure: Secondary | ICD-10-CM | POA: Diagnosis not present

## 2022-10-01 DIAGNOSIS — M6250 Muscle wasting and atrophy, not elsewhere classified, unspecified site: Secondary | ICD-10-CM | POA: Diagnosis not present

## 2022-10-01 DIAGNOSIS — I5032 Chronic diastolic (congestive) heart failure: Secondary | ICD-10-CM | POA: Diagnosis not present

## 2022-10-01 DIAGNOSIS — R278 Other lack of coordination: Secondary | ICD-10-CM | POA: Diagnosis not present

## 2022-10-01 DIAGNOSIS — I739 Peripheral vascular disease, unspecified: Secondary | ICD-10-CM | POA: Diagnosis not present

## 2022-10-01 DIAGNOSIS — I110 Hypertensive heart disease with heart failure: Secondary | ICD-10-CM | POA: Diagnosis not present
